# Patient Record
Sex: Female | Born: 1985 | Race: Black or African American | Hispanic: No | Marital: Single | Smoking: Never smoker
Health system: Southern US, Community
[De-identification: ages and names within clinical notes are randomized; demographics above are authoritative.]

## PROBLEM LIST (undated history)

## (undated) DIAGNOSIS — Q510 Agenesis and aplasia of uterus: Secondary | ICD-10-CM

## (undated) DIAGNOSIS — F445 Conversion disorder with seizures or convulsions: Secondary | ICD-10-CM

## (undated) DIAGNOSIS — R569 Unspecified convulsions: Secondary | ICD-10-CM

## (undated) DIAGNOSIS — I10 Essential (primary) hypertension: Secondary | ICD-10-CM

## (undated) HISTORY — PX: NO PAST SURGERIES: SHX2092

## (undated) HISTORY — DX: Agenesis and aplasia of uterus: Q51.0

## (undated) HISTORY — PX: KNEE SURGERY: SHX244

---
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
## 2003-03-29 ENCOUNTER — Emergency Department (HOSPITAL_COMMUNITY): Admission: EM | Admit: 2003-03-29 | Discharge: 2003-03-29 | Payer: Self-pay | Admitting: Emergency Medicine

## 2003-08-19 ENCOUNTER — Inpatient Hospital Stay (HOSPITAL_COMMUNITY): Admission: AD | Admit: 2003-08-19 | Discharge: 2003-08-19 | Payer: Self-pay | Admitting: Obstetrics and Gynecology

## 2003-10-02 ENCOUNTER — Emergency Department (HOSPITAL_COMMUNITY): Admission: EM | Admit: 2003-10-02 | Discharge: 2003-10-02 | Payer: Self-pay | Admitting: Emergency Medicine

## 2003-12-07 ENCOUNTER — Emergency Department (HOSPITAL_COMMUNITY): Admission: EM | Admit: 2003-12-07 | Discharge: 2003-12-07 | Payer: Self-pay

## 2003-12-16 ENCOUNTER — Ambulatory Visit (HOSPITAL_COMMUNITY): Admission: RE | Admit: 2003-12-16 | Discharge: 2003-12-16 | Payer: Self-pay | Admitting: Gastroenterology

## 2004-12-11 ENCOUNTER — Emergency Department (HOSPITAL_COMMUNITY): Admission: EM | Admit: 2004-12-11 | Discharge: 2004-12-11 | Payer: Self-pay | Admitting: Emergency Medicine

## 2007-08-25 ENCOUNTER — Emergency Department (HOSPITAL_COMMUNITY): Admission: EM | Admit: 2007-08-25 | Discharge: 2007-08-25 | Payer: Self-pay | Admitting: Emergency Medicine

## 2008-08-30 ENCOUNTER — Emergency Department (HOSPITAL_COMMUNITY): Admission: EM | Admit: 2008-08-30 | Discharge: 2008-08-30 | Payer: Self-pay | Admitting: Emergency Medicine

## 2009-04-28 ENCOUNTER — Ambulatory Visit (HOSPITAL_COMMUNITY): Admission: RE | Admit: 2009-04-28 | Discharge: 2009-04-28 | Payer: Self-pay | Admitting: Obstetrics

## 2011-02-24 ENCOUNTER — Inpatient Hospital Stay (HOSPITAL_COMMUNITY): Payer: Managed Care, Other (non HMO)

## 2011-02-24 ENCOUNTER — Inpatient Hospital Stay (HOSPITAL_COMMUNITY)
Admission: AD | Admit: 2011-02-24 | Discharge: 2011-02-25 | Disposition: A | Discharge status: Home / Self Care | Payer: Managed Care, Other (non HMO) | Source: Ambulatory Visit | Attending: Obstetrics & Gynecology | Admitting: Obstetrics & Gynecology

## 2011-02-24 DIAGNOSIS — N949 Unspecified condition associated with female genital organs and menstrual cycle: Secondary | ICD-10-CM | POA: Insufficient documentation

## 2011-02-24 DIAGNOSIS — N938 Other specified abnormal uterine and vaginal bleeding: Secondary | ICD-10-CM | POA: Insufficient documentation

## 2011-02-24 LAB — POCT PREGNANCY, URINE: Preg Test, Ur: NEGATIVE

## 2011-02-24 LAB — URINE MICROSCOPIC-ADD ON

## 2011-02-24 LAB — URINALYSIS, ROUTINE W REFLEX MICROSCOPIC
Bilirubin Urine: NEGATIVE
Glucose, UA: NEGATIVE mg/dL
Nitrite: NEGATIVE
Specific Gravity, Urine: 1.01 (ref 1.005–1.030)
pH: 6 (ref 5.0–8.0)

## 2011-02-24 LAB — CBC
Hemoglobin: 11.6 g/dL — ABNORMAL LOW (ref 12.0–15.0)
MCHC: 32.6 g/dL (ref 30.0–36.0)
RDW: 15.1 % (ref 11.5–15.5)

## 2011-02-25 LAB — URINALYSIS, ROUTINE W REFLEX MICROSCOPIC
Glucose, UA: NEGATIVE mg/dL
Leukocytes, UA: NEGATIVE
Protein, ur: NEGATIVE mg/dL
Urobilinogen, UA: 0.2 mg/dL (ref 0.0–1.0)

## 2011-02-25 LAB — URINE MICROSCOPIC-ADD ON

## 2011-03-14 ENCOUNTER — Encounter: Payer: Managed Care, Other (non HMO) | Admitting: Physician Assistant

## 2011-03-14 DIAGNOSIS — Q519 Congenital malformation of uterus and cervix, unspecified: Secondary | ICD-10-CM

## 2011-03-14 DIAGNOSIS — Q529 Congenital malformation of female genitalia, unspecified: Secondary | ICD-10-CM

## 2011-03-14 DIAGNOSIS — Q524 Other congenital malformations of vagina: Secondary | ICD-10-CM

## 2011-03-21 NOTE — Group Therapy Note (Signed)
NAME:  Sonia Morris, Sonia Morris                ACCOUNT NO.:  [id number]  MEDICAL RECORD NO.:  [id number]           PATIENT TYPE:  A  LOCATION:  [facility]                   FACILITY:  [facility]  PHYSICIAN:  Maylon Cos, CNM    DATE OF BIRTH:  15-May-1986  DATE OF SERVICE:  03/14/2011                                 CLINIC NOTE  The patient presents to the GYN Clinic at [facility].  REASON FOR TODAY'S VISIT:  Followup from [facility] for complaints of vaginal bleeding.  HISTORY OF PRESENT ILLNESS:  The patient is a 25 year old nulligravida patient who was originally seen on February 24, 2011, by __________ [facility]U with complaint of bleeding after intercourse.  The patient is known to have a questionable ambiguous genitalia on ultrasound and CT scan.  She is completing data whether she has an absent uterus versus a very small uterus.  However, she has a blind patch and very short vagina.  On the day that she presented to the [facility]U, she had had recent intercourse and had had more bleeding than usual.  Bleeding lasted for approximately 24 hours and was described as spotting on tissue after voiding.  Bleeding resolved spontaneously and she presents today for followup only at the recommendation of MAU.  She states that she has not had any additional bleeding and she feels that the bleeding was related to the fact that she had tried a different position during intercourse than previously tried with her partner.  She denies pain with intercourse.  She was previously worked up by her primary care provider as a young child, has no questions about her previous diagnosis and no concerns.  PHYSICAL EXAMINATION:  GENERAL:  Sonia Morris is a pleasant 25 year old African American female in no apparent distress. VITAL SIGNS:  Stable; temperature is 98.9, her pulse is 92, her blood pressure is 117/78, her weight is 118.9, her height is 68 inches. HEENT:  Grossly normal. BREASTS:  She has very large pendulous breasts. ABDOMEN:   Soft, nontender. GENITALIA:  She is a Tanner V.  External genitalia without lesions. Mucous membranes are pink without lesions.  She does have a very short vaginal vault that measures approximately 3 cm.  No ovaries or uterus are palpated on examination. EXTREMITIES:  Lower extremities are without edema.  ASSESSMENT: 1. Vaginal bleeding with intercourse secondary to short vaginal vault. 2. Ambiguous genitalia, resolved.  PLAN:  We will obtain all records related to previous workup to confirm diagnosis and confirm no further followup and evaluation is needed.          ______________________________ Maylon Cos, CNM    SS/MEDQ  D:  03/20/2011  T:  03/21/2011  Job:  [id number]

## 2011-04-01 ENCOUNTER — Inpatient Hospital Stay (HOSPITAL_COMMUNITY)
Admission: AD | Admit: 2011-04-01 | Discharge: 2011-04-01 | Disposition: A | Discharge status: Home / Self Care | Payer: Managed Care, Other (non HMO) | Source: Ambulatory Visit | Attending: Obstetrics and Gynecology | Admitting: Obstetrics and Gynecology

## 2011-04-01 DIAGNOSIS — N938 Other specified abnormal uterine and vaginal bleeding: Secondary | ICD-10-CM | POA: Insufficient documentation

## 2011-04-01 DIAGNOSIS — N949 Unspecified condition associated with female genital organs and menstrual cycle: Secondary | ICD-10-CM

## 2011-04-01 DIAGNOSIS — N390 Urinary tract infection, site not specified: Secondary | ICD-10-CM

## 2011-04-01 DIAGNOSIS — N8111 Cystocele, midline: Secondary | ICD-10-CM

## 2011-04-01 DIAGNOSIS — R319 Hematuria, unspecified: Secondary | ICD-10-CM

## 2011-04-01 LAB — URINALYSIS, ROUTINE W REFLEX MICROSCOPIC
Glucose, UA: NEGATIVE mg/dL
Glucose, UA: NEGATIVE mg/dL
Ketones, ur: 15 mg/dL — AB
Leukocytes, UA: NEGATIVE
Nitrite: NEGATIVE
Protein, ur: 30 mg/dL — AB
pH: 6.5 (ref 5.0–8.0)

## 2011-04-01 LAB — DIFFERENTIAL
Basophils Relative: 0 % (ref 0–1)
Eosinophils Absolute: 0.2 10*3/uL (ref 0.0–0.7)
Eosinophils Relative: 1 % (ref 0–5)
Lymphocytes Relative: 12 % (ref 12–46)
Neutrophils Relative %: 78 % — ABNORMAL HIGH (ref 43–77)

## 2011-04-01 LAB — URINE MICROSCOPIC-ADD ON

## 2011-04-01 LAB — CBC
Hemoglobin: 11.1 g/dL — ABNORMAL LOW (ref 12.0–15.0)
MCHC: 31.9 g/dL (ref 30.0–36.0)
RBC: 4.83 MIL/uL (ref 3.87–5.11)

## 2011-04-12 ENCOUNTER — Encounter: Payer: Self-pay | Admitting: Family Medicine

## 2011-04-12 ENCOUNTER — Ambulatory Visit (INDEPENDENT_AMBULATORY_CARE_PROVIDER_SITE_OTHER): Payer: Managed Care, Other (non HMO) | Admitting: Family Medicine

## 2011-04-12 DIAGNOSIS — IMO0002 Reserved for concepts with insufficient information to code with codable children: Secondary | ICD-10-CM | POA: Insufficient documentation

## 2011-04-12 DIAGNOSIS — G43909 Migraine, unspecified, not intractable, without status migrainosus: Secondary | ICD-10-CM | POA: Insufficient documentation

## 2011-04-12 DIAGNOSIS — Q510 Agenesis and aplasia of uterus: Secondary | ICD-10-CM | POA: Insufficient documentation

## 2011-04-12 DIAGNOSIS — N8111 Cystocele, midline: Secondary | ICD-10-CM

## 2011-04-12 NOTE — Patient Instructions (Signed)
Please schedule your complete physical at your convenience We'll call you with your urology appt Buy the store brand Excedrin for Migraine (Acetaminophen, Aspirin, Caffeine) and take as needed for headaches Call with any questions or concerns Have a great weekend!

## 2011-04-12 NOTE — Progress Notes (Signed)
  Subjective:    Patient ID: Sonia Morris, female    DOB: 1986/08/25, 25 y.o.   MRN: [id number]  HPI New to establish.  Previous MD- none.    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  Not having paps.  Uncertain if she has ovaries.  Will need to review records  Migraines- chronic problem for pt, will take tylenol as needed.  HAs typically last 2-3 days.  No nausea.  Will occasionally have photo/phonophobia.  Previously took Excedrin for Migraine but stopped when they pulled it from the shelves.    Cystocele- was seen in the ER on 5/28 for UTI, hematuria, and cystocele.  'it was like something fell out'.  Given 10 days of Bactrim.  Referred to urology.  Never made appt.  Pt reports feeling much better.  Cystocele is still present but 'much smaller.  You really can't tell'.  Review of Systems For ROS see HPI     Objective:   Physical Exam  Constitutional: She is oriented to person, place, and time. She appears well-developed and well-nourished. No distress.  HENT:  Head: Normocephalic and atraumatic.  Eyes: Conjunctivae and EOM are normal. Pupils are equal, round, and reactive to light.  Neck: Normal range of motion. Neck supple. No thyromegaly present.  Cardiovascular: Normal rate, regular rhythm, normal heart sounds and intact distal pulses.   No murmur heard. Pulmonary/Chest: Effort normal and breath sounds normal. No respiratory distress.  Abdominal: Soft. She exhibits no distension. There is no tenderness.  Musculoskeletal: She exhibits no edema.  Lymphadenopathy:    She has no cervical adenopathy.  Neurological: She is alert and oriented to person, place, and time. She has normal reflexes. No cranial nerve deficit.  Skin: Skin is warm and dry.  Psychiatric: She has a normal mood and affect. Her behavior is normal.          Assessment & Plan:

## 2011-04-21 ENCOUNTER — Encounter: Payer: Self-pay | Admitting: Family Medicine

## 2011-04-21 NOTE — Assessment & Plan Note (Signed)
Upon reviewing the literature, agenesis of the uterus is often associated w/ renal abnormalities.  Will refer to urology for complete evaluation

## 2011-04-21 NOTE — Assessment & Plan Note (Signed)
Pt reports this has improved symptomatically but given her uterine agenesis will need to see urology regardless of cystocele sxs.  Pt expressed understanding.

## 2011-04-21 NOTE — Assessment & Plan Note (Signed)
Pt's HAs don't sound typical of migraines other than the duration.  Discussed that while brand name Excedrin Migraine is off the market there are numerous store brand options available.  Pt to try this.  If they do not provide relief will proceed w/ migraine tx.  Reviewed supportive care and red flags that should prompt return.  Pt expressed understanding and is in agreement w/ plan.

## 2011-05-17 ENCOUNTER — Encounter: Payer: Self-pay | Admitting: Family Medicine

## 2011-05-17 ENCOUNTER — Ambulatory Visit (INDEPENDENT_AMBULATORY_CARE_PROVIDER_SITE_OTHER): Payer: Managed Care, Other (non HMO) | Admitting: Family Medicine

## 2011-05-17 DIAGNOSIS — Q510 Agenesis and aplasia of uterus: Secondary | ICD-10-CM

## 2011-05-17 DIAGNOSIS — Z0000 Encounter for general adult medical examination without abnormal findings: Secondary | ICD-10-CM | POA: Insufficient documentation

## 2011-05-17 LAB — BASIC METABOLIC PANEL
BUN: 10 mg/dL (ref 6–23)
Calcium: 8.6 mg/dL (ref 8.4–10.5)
Creatinine, Ser: 0.6 mg/dL (ref 0.4–1.2)
GFR: 163.26 mL/min (ref >60.00)
Glucose, Bld: 74 mg/dL (ref 70–99)

## 2011-05-17 LAB — CBC WITH DIFFERENTIAL/PLATELET
Basophils Relative: 0.5 % (ref 0.0–3.0)
Eosinophils Absolute: 0.2 10*3/uL (ref 0.0–0.7)
HCT: 34.8 % — ABNORMAL LOW (ref 36.0–46.0)
Hemoglobin: 11.2 g/dL — ABNORMAL LOW (ref 12.0–15.0)
Lymphs Abs: 2.4 10*3/uL (ref 0.7–4.0)
MCHC: 32.3 g/dL (ref 30.0–36.0)
MCV: 73.6 fl — ABNORMAL LOW (ref 78.0–100.0)
Monocytes Absolute: 0.6 10*3/uL (ref 0.1–1.0)
Neutro Abs: 6.9 10*3/uL (ref 1.4–7.7)
Neutrophils Relative %: 67.9 % (ref 43.0–77.0)
RBC: 4.72 Mil/uL (ref 3.87–5.11)

## 2011-05-17 LAB — LDL CHOLESTEROL, DIRECT: Direct LDL: 140.5 mg/dL

## 2011-05-17 LAB — HEPATIC FUNCTION PANEL
Albumin: 3.9 g/dL (ref 3.5–5.2)
Total Bilirubin: 0.2 mg/dL — ABNORMAL LOW (ref 0.3–1.2)

## 2011-05-17 LAB — LIPID PANEL
Cholesterol: 203 mg/dL — ABNORMAL HIGH (ref 0–200)
HDL: 55.1 mg/dL (ref >39.00)
VLDL: 15.4 mg/dL (ref 0.0–40.0)

## 2011-05-17 LAB — TSH: TSH: 0.47 u[IU]/mL (ref 0.35–5.50)

## 2011-05-17 NOTE — Assessment & Plan Note (Signed)
After reading article in UpToDate will get renal US due to association of uterine agenesis w/ renal abnormalities.  Pt expressed understanding and is in agreement w/ plan.

## 2011-05-17 NOTE — Progress Notes (Signed)
  Subjective:    Patient ID: Sonia Morris, female    DOB: [birth year], 25 y.o.   MRN: [id number]  HPI CPE- no concerns today.   Review of Systems Patient reports no vision/ hearing changes, adenopathy, fever, weight change,  persistant/recurrent hoarseness , swallowing issues, chest pain, palpitations, edema, persistant/recurrent cough, hemoptysis, dyspnea (rest/exertional/paroxysmal nocturnal), gastrointestinal bleeding (melena, rectal bleeding), abdominal pain, significant heartburn, bowel changes, GU symptoms (dysuria, hematuria, incontinence), Gyn symptoms (abnormal  bleeding, pain),  syncope, focal weakness, memory loss, numbness & tingling, skin/hair/nail changes, abnormal bruising or bleeding, anxiety, or depression.     Objective:   Physical Exam  General Appearance:    Alert, cooperative, no distress, appears stated age, overwt  Head:    Normocephalic, without obvious abnormality, atraumatic  Eyes:    PERRL, conjunctiva/corneas clear, EOM's intact, fundi    benign, both eyes  Ears:    Normal TM's and external ear canals, both ears  Nose:   Nares normal, septum midline, mucosa normal, no drainage    or sinus tenderness  Throat:   Lips, mucosa, and tongue normal; teeth and gums normal  Neck:   Supple, symmetrical, trachea midline, no adenopathy;    Thyroid: no enlargement/tenderness/nodules  Back:     Symmetric, no curvature, ROM normal, no CVA tenderness  Lungs:     Clear to auscultation bilaterally, respirations unlabored  Chest Wall:    No tenderness or deformity   Heart:    Regular rate and rhythm, S1 and S2 normal, no murmur, rub   or gallop  Breast Exam:    Deferred  Abdomen:     Soft, non-tender, bowel sounds active all four quadrants,    no masses, no organomegaly  Genitalia:    Deferred to GYN  Rectal:    Deferred to GYN  Extremities:   Extremities normal, atraumatic, no cyanosis or edema  Pulses:   2+ and symmetric all extremities  Skin:   Skin color, texture,  turgor normal, no rashes or lesions  Lymph nodes:   Cervical, supraclavicular, and axillary nodes normal  Neurologic:   CNII-XII intact, normal strength, sensation and reflexes    throughout          Assessment & Plan:

## 2011-05-17 NOTE — Assessment & Plan Note (Signed)
Pt's PE WNL.  Check labs.  Reviewed importance of healthy diet and regular exercise.

## 2011-05-17 NOTE — Patient Instructions (Signed)
We'll notify you of your lab results Someone will call to schedule your kidney ultrasound Try and get regular exercise and make healthy food choices Call with any questions or concerns Have a great summer!

## 2011-05-20 ENCOUNTER — Ambulatory Visit
Admission: RE | Admit: 2011-05-20 | Discharge: 2011-05-20 | Disposition: A | Discharge status: Home / Self Care | Payer: Managed Care, Other (non HMO) | Source: Ambulatory Visit | Attending: Family Medicine | Admitting: Family Medicine

## 2011-05-20 DIAGNOSIS — Q510 Agenesis and aplasia of uterus: Secondary | ICD-10-CM

## 2011-05-20 LAB — VITAMIN D 1,25 DIHYDROXY: Vitamin D2 1, 25 (OH)2: <8 pg/mL

## 2011-05-22 ENCOUNTER — Other Ambulatory Visit: Payer: Managed Care, Other (non HMO)

## 2011-06-06 ENCOUNTER — Telehealth: Payer: Self-pay | Admitting: Family Medicine

## 2011-06-06 NOTE — Telephone Encounter (Signed)
PER [facility], PATIENT CANCELLED HER APPOINTMENT IN JULY & DID NOT RSC'D.  I HAVE LM FOR PATIENT TO CALL ME.

## 2011-06-06 NOTE — Telephone Encounter (Signed)
Per dr hopper U/S results are as follows: kidney appear normal with the exception of incidental gallstones. Left message to call office

## 2011-06-07 NOTE — Telephone Encounter (Signed)
Kidneys are normal- but pt was supposed to see urology for a separate reason (cystocele vs urocele)

## 2011-06-07 NOTE — Telephone Encounter (Signed)
Pt return call stating that she is planning to see urology and has reschedule urology appt.

## 2011-06-07 NOTE — Telephone Encounter (Signed)
Left message to call office

## 2011-08-05 LAB — DIFFERENTIAL
Basophils Absolute: 0
Basophils Relative: 0
Eosinophils Absolute: 0.1
Eosinophils Relative: 1
Lymphocytes Relative: 17
Lymphs Abs: 2.1
Monocytes Absolute: 0.7
Monocytes Relative: 6
Neutro Abs: 9.3 — ABNORMAL HIGH
Neutrophils Relative %: 76

## 2011-08-05 LAB — BASIC METABOLIC PANEL
CO2: 27
Chloride: 101
Creatinine, Ser: 0.61
GFR calc Af Amer: >60
Potassium: 4

## 2011-08-05 LAB — CBC
HCT: 38.3
Hemoglobin: 12.2
MCHC: 31.8
MCV: 72.6 — ABNORMAL LOW
Platelets: 310
RBC: 5.28 — ABNORMAL HIGH
RDW: 14.4
WBC: 12.2 — ABNORMAL HIGH

## 2011-08-05 LAB — POCT PREGNANCY, URINE: Preg Test, Ur: NEGATIVE

## 2011-08-05 LAB — BASIC METABOLIC PANEL WITH GFR
BUN: 10
Calcium: 9.3
GFR calc non Af Amer: >60
Glucose, Bld: 97
Sodium: 137

## 2012-06-04 ENCOUNTER — Emergency Department (HOSPITAL_BASED_OUTPATIENT_CLINIC_OR_DEPARTMENT_OTHER)
Admission: EM | Admit: 2012-06-04 | Discharge: 2012-06-04 | Disposition: A | Discharge status: Home / Self Care | Payer: Self-pay | Attending: Emergency Medicine | Admitting: Emergency Medicine

## 2012-06-04 ENCOUNTER — Encounter (HOSPITAL_BASED_OUTPATIENT_CLINIC_OR_DEPARTMENT_OTHER): Payer: Self-pay | Admitting: Emergency Medicine

## 2012-06-04 DIAGNOSIS — M25569 Pain in unspecified knee: Secondary | ICD-10-CM | POA: Insufficient documentation

## 2012-06-04 NOTE — ED Notes (Signed)
States she had an old injury to her left knee that hurts intermittently.  Is here for a note for work stating she is able to do her job.

## 2012-06-04 NOTE — ED Provider Notes (Signed)
History     CSN: [id number]  Arrival date & time 06/04/12  1825   First MD Initiated Contact with Patient 06/04/12 1840      Chief Complaint  Patient presents with  . Knee Pain    (Consider location/radiation/quality/duration/timing/severity/associated sxs/prior treatment) HPI Comments: She states she's only here to get a note that says she is able to start her job tomorrow  Patient is a 26 y.o. female presenting with knee pain. The history is provided by the patient.  Knee Pain Chronicity: Patient injured her knee 5 years ago. She occasionally will have a pain in the knee but currently is not having any pain. Episode onset: 5 years ago. The problem occurs rarely. The problem has not changed since onset.Associated symptoms comments: No current complaint. Nothing aggravates the symptoms. Nothing relieves the symptoms. She has tried nothing for the symptoms.    Past Medical History  Diagnosis Date  . Migraine   . Agenesis of uterus     History reviewed. No pertinent past surgical history.  Family History  Problem Relation Age of Onset  . Hypertension Mother     History  Substance Use Topics  . Smoking status: Never Smoker   . Smokeless tobacco: Not on file  . Alcohol Use: No    OB History    Grav Para Term Preterm Abortions TAB SAB Ect Mult Living                  Review of Systems  All other systems reviewed and are negative.    Allergies  Vicodin  Home Medications   Current Outpatient Rx  Name Route Sig Dispense Refill  . ACETAMINOPHEN 500 MG PO TABS Oral Take 1,000 mg by mouth every 6 (six) hours as needed. For headache      BP 133/100  Pulse 85  Temp 98.1 F (36.7 C) (Oral)  Resp 16  Ht 5\' 9"  (1.753 m)  Wt 200 lb (90.719 kg)  BMI 29.53 kg/m2  SpO2 100%  Physical Exam  Nursing note and vitals reviewed. Constitutional: She is oriented to person, place, and time. She appears well-developed and well-nourished. No distress.  HENT:  Head:  Normocephalic and atraumatic.  Musculoskeletal:       Left knee: She exhibits normal range of motion, no swelling, no effusion, normal alignment, no LCL laxity, normal patellar mobility, no bony tenderness, normal meniscus and no MCL laxity. no tenderness found.  Neurological: She is alert and oriented to person, place, and time.  Skin: Skin is warm and dry.    ED Course  Procedures (including critical care time)  Labs Reviewed - No data to display No results found.   No diagnosis found.    MDM   Patient is here to get a work release note. She states she injured her knee 5 years ago but only has mild tenderness before it rains her when the weather changes. She denies any tenderness today in completing move her knee with a normal knee exam        Gwyneth Sprout, MD 06/04/12 1849

## 2012-08-08 ENCOUNTER — Emergency Department (HOSPITAL_COMMUNITY): Payer: 59

## 2012-08-08 ENCOUNTER — Encounter (HOSPITAL_COMMUNITY): Payer: Self-pay | Admitting: *Deleted

## 2012-08-08 ENCOUNTER — Emergency Department (HOSPITAL_COMMUNITY)
Admission: EM | Admit: 2012-08-08 | Discharge: 2012-08-08 | Disposition: A | Discharge status: Home / Self Care | Payer: 59 | Attending: Emergency Medicine | Admitting: Emergency Medicine

## 2012-08-08 DIAGNOSIS — T1490XA Injury, unspecified, initial encounter: Secondary | ICD-10-CM | POA: Insufficient documentation

## 2012-08-08 DIAGNOSIS — R079 Chest pain, unspecified: Secondary | ICD-10-CM | POA: Insufficient documentation

## 2012-08-08 DIAGNOSIS — R109 Unspecified abdominal pain: Secondary | ICD-10-CM | POA: Insufficient documentation

## 2012-08-08 DIAGNOSIS — S0003XA Contusion of scalp, initial encounter: Secondary | ICD-10-CM | POA: Insufficient documentation

## 2012-08-08 DIAGNOSIS — F10929 Alcohol use, unspecified with intoxication, unspecified: Secondary | ICD-10-CM

## 2012-08-08 DIAGNOSIS — F1010 Alcohol abuse, uncomplicated: Secondary | ICD-10-CM | POA: Insufficient documentation

## 2012-08-08 LAB — CBC WITH DIFFERENTIAL/PLATELET
Basophils Relative: 0 % (ref 0–1)
Eosinophils Absolute: 0.1 10*3/uL (ref 0.0–0.7)
HCT: 38.2 % (ref 36.0–46.0)
Hemoglobin: 12.1 g/dL (ref 12.0–15.0)
MCH: 22.7 pg — ABNORMAL LOW (ref 26.0–34.0)
MCHC: 31.7 g/dL (ref 30.0–36.0)
Monocytes Absolute: 0.6 10*3/uL (ref 0.1–1.0)
Neutro Abs: 6 10*3/uL (ref 1.7–7.7)
Neutrophils Relative %: 57 % (ref 43–77)

## 2012-08-08 LAB — BASIC METABOLIC PANEL
BUN: 9 mg/dL (ref 6–23)
Calcium: 9.3 mg/dL (ref 8.4–10.5)
GFR calc non Af Amer: >90 mL/min (ref >90)
Glucose, Bld: 86 mg/dL (ref 70–99)

## 2012-08-08 MED ORDER — IOHEXOL 300 MG/ML  SOLN: 100.0000 mL | Freq: Once | INTRAMUSCULAR | Status: DC | PRN
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
## 2012-08-08 NOTE — ED Notes (Signed)
Pt returned from radiology, boyfriend at bedside.

## 2012-11-06 ENCOUNTER — Emergency Department (HOSPITAL_BASED_OUTPATIENT_CLINIC_OR_DEPARTMENT_OTHER)
Admission: EM | Admit: 2012-11-06 | Discharge: 2012-11-06 | Disposition: A | Discharge status: Home / Self Care | Payer: Self-pay | Attending: Emergency Medicine | Admitting: Emergency Medicine

## 2012-11-06 ENCOUNTER — Encounter (HOSPITAL_BASED_OUTPATIENT_CLINIC_OR_DEPARTMENT_OTHER): Payer: Self-pay | Admitting: *Deleted

## 2012-11-06 DIAGNOSIS — Q510 Agenesis and aplasia of uterus: Secondary | ICD-10-CM | POA: Insufficient documentation

## 2012-11-06 DIAGNOSIS — Z8679 Personal history of other diseases of the circulatory system: Secondary | ICD-10-CM | POA: Insufficient documentation

## 2012-11-06 DIAGNOSIS — J069 Acute upper respiratory infection, unspecified: Secondary | ICD-10-CM | POA: Insufficient documentation

## 2012-11-06 NOTE — ED Provider Notes (Signed)
History     CSN: [id number]  Arrival date & time 11/06/12  1455   First MD Initiated Contact with Patient 11/06/12 1508      Chief Complaint  Patient presents with  . Cough    (Consider location/radiation/quality/duration/timing/severity/associated sxs/prior treatment) HPI Patient complains of cough sneeze nasal congestion and diffuse body aches onset 5 days ago. Treated with TheraFlu, without relief. No fever no shortness of breath no other associated symptoms no nausea and vomiting nothing makes symptoms better or worse Past Medical History  Diagnosis Date  . Migraine   . Agenesis of uterus     History reviewed. No pertinent past surgical history.  Family History  Problem Relation Age of Onset  . Hypertension Mother     History  Substance Use Topics  . Smoking status: Never Smoker   . Smokeless tobacco: Not on file  . Alcohol Use: No    OB History    Grav Para Term Preterm Abortions TAB SAB Ect Mult Living                  Review of Systems  Constitutional: Negative.   HENT: Positive for congestion and sneezing.   Respiratory: Positive for cough.   Cardiovascular: Negative.   Gastrointestinal: Negative.   Musculoskeletal: Positive for myalgias.  Skin: Negative.   Neurological: Negative.   Hematological: Negative.   Psychiatric/Behavioral: Negative.   All other systems reviewed and are negative.    Allergies  Vicodin  Home Medications   Current Outpatient Rx  Name  Route  Sig  Dispense  Refill  . ACETAMINOPHEN 500 MG PO TABS   Oral   Take 1,000 mg by mouth every 6 (six) hours as needed. For headache           BP 128/90  Pulse 95  Temp 98.4 F (36.9 C) (Oral)  Resp 20  SpO2 100%  Physical Exam  Nursing note and vitals reviewed. Constitutional: She appears well-developed and well-nourished.  HENT:  Head: Normocephalic and atraumatic.  Right Ear: External ear normal.  Left Ear: External ear normal.  Mouth/Throat: Oropharynx is clear  and moist. No oropharyngeal exudate.       Nasal congestion bilateral tympanic membranes normal  Eyes: Conjunctivae normal are normal. Pupils are equal, round, and reactive to light.  Neck: Neck supple. No tracheal deviation present. No thyromegaly present.  Cardiovascular: Normal rate and regular rhythm.   No murmur heard. Pulmonary/Chest: Effort normal and breath sounds normal.  Abdominal: Soft. Bowel sounds are normal. She exhibits no distension. There is no tenderness.       Obese  Musculoskeletal: Normal range of motion. She exhibits no edema and no tenderness.  Neurological: She is alert. Coordination normal.  Skin: Skin is warm and dry. No rash noted.  Psychiatric: She has a normal mood and affect.    ED Course  Procedures (including critical care time)  Labs Reviewed - No data to display No results found.   No diagnosis found.    MDM  History and exam consistent with URI. Plan symptomatic care Diagnosis upper respiratory tract infection        Doug Sou, MD 11/06/12 [phone]

## 2012-11-06 NOTE — ED Notes (Signed)
Cough, congestion and headache x 5 days.

## 2012-11-06 NOTE — ED Notes (Signed)
MD at bedside. 

## 2012-12-07 ENCOUNTER — Inpatient Hospital Stay (HOSPITAL_COMMUNITY)
Admission: AD | Admit: 2012-12-07 | Discharge: 2012-12-07 | Disposition: A | Discharge status: Home / Self Care | Payer: Self-pay | Source: Ambulatory Visit | Attending: Obstetrics & Gynecology | Admitting: Obstetrics & Gynecology

## 2012-12-07 ENCOUNTER — Encounter (HOSPITAL_COMMUNITY): Payer: Self-pay | Admitting: *Deleted

## 2012-12-07 DIAGNOSIS — N949 Unspecified condition associated with female genital organs and menstrual cycle: Secondary | ICD-10-CM | POA: Insufficient documentation

## 2012-12-07 DIAGNOSIS — Q510 Agenesis and aplasia of uterus: Secondary | ICD-10-CM | POA: Insufficient documentation

## 2012-12-07 DIAGNOSIS — N939 Abnormal uterine and vaginal bleeding, unspecified: Secondary | ICD-10-CM

## 2012-12-07 DIAGNOSIS — R109 Unspecified abdominal pain: Secondary | ICD-10-CM | POA: Insufficient documentation

## 2012-12-07 DIAGNOSIS — N938 Other specified abnormal uterine and vaginal bleeding: Secondary | ICD-10-CM | POA: Insufficient documentation

## 2012-12-07 HISTORY — DX: Essential (primary) hypertension: I10

## 2012-12-07 LAB — URINE MICROSCOPIC-ADD ON

## 2012-12-07 LAB — URINALYSIS, ROUTINE W REFLEX MICROSCOPIC
Bilirubin Urine: NEGATIVE
Glucose, UA: NEGATIVE mg/dL
Ketones, ur: NEGATIVE mg/dL
Leukocytes, UA: NEGATIVE
Nitrite: NEGATIVE
Protein, ur: NEGATIVE mg/dL
Specific Gravity, Urine: 1.02 (ref 1.005–1.030)
Urobilinogen, UA: 0.2 mg/dL (ref 0.0–1.0)
pH: 7 (ref 5.0–8.0)

## 2012-12-07 LAB — CBC
HCT: 37.5 % (ref 36.0–46.0)
MCV: 72.5 fL — ABNORMAL LOW (ref 78.0–100.0)
Platelets: 301 10*3/uL (ref 150–400)
RBC: 5.17 MIL/uL — ABNORMAL HIGH (ref 3.87–5.11)
WBC: 10.6 10*3/uL — ABNORMAL HIGH (ref 4.0–10.5)

## 2012-12-07 LAB — WET PREP, GENITAL
Trich, Wet Prep: NONE SEEN
Yeast Wet Prep HPF POC: NONE SEEN

## 2012-12-07 NOTE — MAU Provider Note (Signed)
Attestation of Attending Supervision of Advanced Practitioner (PA/CNM/NP): Evaluation and management procedures were performed by the Advanced Practitioner under my supervision and collaboration.  I have reviewed the Advanced Practitioner's note and chart, and I agree with the management and plan.  Deidrea Gaetz, MD, FACOG Attending Obstetrician & Gynecologist Faculty Practice, [facility]  

## 2012-12-07 NOTE — MAU Provider Note (Signed)
History    CSN: [id number]  Arrival date and time: 12/07/12 1222   First Provider Initiated Contact with Patient 12/07/12 1622      Chief Complaint  Patient presents with  . Vaginal Bleeding  . Abdominal Pain   HPI Patient reports to the MAU reporting of heavy vaginal bleeding over the last week. 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 The patient reports that she has had an episode of vaginal bleeding once a year for the last 5 years. Previously with the bleeding episodes, she reports that she was treated with antibiotics for a bacterial infection and that her bleeding stopped after a week or two. She reports that she has had previous pelvic exams, but does not have these exams on a regular basis.   With this episode of bleeding the patient reports that she has had cramping since last Monday that turned into vaginal bleeding after a few days. On Wednesday and Thursday of last week she was having to change her pads very frequently due to increased bleeding. She reports no bleeding yesterday, with only mild spotting again this morning. She also reports odorous vaginal discharge over the last week. She denies burning with urination but does report an increase in frequency.    Looking at previous US the patient seems to have an atrophic or absent uterus and does have both ovaries. Patient reports normal vaginal anatomy. Patient reports that she is sexually active, and reports intermittent pain with vaginal sex.  OB History    Grav Para Term Preterm Abortions TAB SAB Ect Mult Living                  Past Medical History  Diagnosis Date  . Migraine   . Agenesis of uterus   . Hypertension     Past Surgical History  Procedure Date  . No past surgeries     Family History  Problem Relation Age of Onset  . Hypertension Mother     History  Substance Use Topics  . Smoking status: Never Smoker   . Smokeless tobacco: Never Used  . Alcohol Use:  No    Allergies:  Allergies  Allergen Reactions  . Vicodin (Hydrocodone-Acetaminophen) Itching    Prescriptions prior to admission  Medication Sig Dispense Refill  . acetaminophen (TYLENOL) 500 MG tablet Take 1,000 mg by mouth every 6 (six) hours as needed. For headache        Review of Systems  Constitutional: Negative for fever.  Gastrointestinal: Positive for abdominal pain. Negative for nausea and vomiting.  Genitourinary: Positive for frequency. Negative for dysuria and flank pain.  GU: Positive for vaginal bleeding. Positive for vaginal discharge. Positive for odor.  Physical Exam   Blood pressure 122/76, pulse 83, temperature 97.6 F (36.4 C), temperature source Oral, resp. rate 20, height 5\' 7"  (1.702 m), weight 105.507 kg (232 lb 9.6 oz), SpO2 100.00%.  Physical Exam Physical Examination: General appearance - alert, well appearing, and in no distress, oriented to person, place, and time and well hydrated Abdomen - soft, nontender, nondistended, no masses or organomegaly Pelvic exam: Tissue appearing to be small cervix visualized, unable to clearly visualize os, short but otherwise normal appearing vagina with scant white discharge without odor, vaginal walls and external genitalia normal Bimanual exam: Short vaginal opening, unable to palpate cervix, no tenderness in suprapubic area or lower abdomen, adnexa palpable bilaterally without tenderness, enlargement, or mass   MAU Course  Procedures  Results for orders placed during the hospital encounter of 12/07/12 (from the past 24 hour(s))  URINALYSIS, ROUTINE W REFLEX MICROSCOPIC     Status: Abnormal   Collection Time   12/07/12  1:25 PM      Component Value Range   Color, Urine YELLOW  YELLOW   APPearance CLEAR  CLEAR   Specific Gravity, Urine 1.020  1.005 - 1.030   pH 7.0  5.0 - 8.0   Glucose, UA NEGATIVE  NEGATIVE mg/dL   Hgb urine dipstick MODERATE (*) NEGATIVE   Bilirubin Urine NEGATIVE  NEGATIVE   Ketones, ur  NEGATIVE  NEGATIVE mg/dL   Protein, ur NEGATIVE  NEGATIVE mg/dL   Urobilinogen, UA 0.2  0.0 - 1.0 mg/dL   Nitrite NEGATIVE  NEGATIVE   Leukocytes, UA NEGATIVE  NEGATIVE  URINE MICROSCOPIC-ADD ON     Status: Abnormal   Collection Time   12/07/12  1:25 PM      Component Value Range   Squamous Epithelial / LPF FEW (*) RARE   WBC, UA 0-2  <3 WBC/hpf   RBC / HPF 7-10  <3 RBC/hpf   Bacteria, UA FEW (*) RARE  CBC     Status: Abnormal   Collection Time   12/07/12  4:42 PM      Component Value Range   WBC 10.6 (*) 4.0 - 10.5 K/uL   RBC 5.17 (*) 3.87 - 5.11 MIL/uL   Hemoglobin 11.8 (*) 12.0 - 15.0 g/dL   HCT 60.4  54.0 - 98.1 %   MCV 72.5 (*) 78.0 - 100.0 fL   MCH 22.8 (*) 26.0 - 34.0 pg   MCHC 31.5  30.0 - 36.0 g/dL   RDW 19.1  47.8 - 29.5 %   Platelets 301  150 - 400 K/uL  WET PREP, GENITAL     Status: Normal   Collection Time   12/07/12  6:16 PM      Component Value Range   Yeast Wet Prep HPF POC NONE SEEN  NONE SEEN   Trich, Wet Prep NONE SEEN  NONE SEEN   Clue Cells Wet Prep HPF POC NONE SEEN  NONE SEEN   WBC, Wet Prep HPF POC NONE SEEN  NONE SEEN    Assessment and Plan   1. Vaginal bleeding   2. Agenesis of uterus     Plan: D/C home Message sent to Gyn clinic to schedule f/u for pt r/t periodic bleeding Ibuprofen OTC for mild pain Urine sent for culture Return to MAU as needed  LEFTWICH-KIRBY, Lynix Bonine 12/07/2012, 5:18 PM

## 2012-12-07 NOTE — MAU Note (Signed)
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 States she has had bleeding on and off since 1-29. Had been cramping since 1-27. Having slight cramping and spotting today. Has been heavier over the past weekend.

## 2012-12-07 NOTE — MAU Note (Signed)
Patient is not in the lobby when called to triage.  

## 2012-12-08 ENCOUNTER — Encounter: Payer: Self-pay | Admitting: Obstetrics & Gynecology

## 2012-12-09 LAB — URINE CULTURE

## 2012-12-28 ENCOUNTER — Encounter: Payer: 59 | Admitting: Obstetrics & Gynecology

## 2013-01-01 ENCOUNTER — Encounter: Payer: 59 | Admitting: Obstetrics & Gynecology

## 2013-03-12 ENCOUNTER — Encounter (HOSPITAL_BASED_OUTPATIENT_CLINIC_OR_DEPARTMENT_OTHER): Payer: Self-pay | Admitting: *Deleted

## 2013-03-12 ENCOUNTER — Emergency Department (HOSPITAL_BASED_OUTPATIENT_CLINIC_OR_DEPARTMENT_OTHER)
Admission: EM | Admit: 2013-03-12 | Discharge: 2013-03-12 | Disposition: A | Discharge status: Home / Self Care | Payer: Self-pay | Attending: Emergency Medicine | Admitting: Emergency Medicine

## 2013-03-12 DIAGNOSIS — I10 Essential (primary) hypertension: Secondary | ICD-10-CM | POA: Insufficient documentation

## 2013-03-12 DIAGNOSIS — Z8742 Personal history of other diseases of the female genital tract: Secondary | ICD-10-CM | POA: Insufficient documentation

## 2013-03-12 DIAGNOSIS — Z8679 Personal history of other diseases of the circulatory system: Secondary | ICD-10-CM | POA: Insufficient documentation

## 2013-03-12 DIAGNOSIS — H6120 Impacted cerumen, unspecified ear: Secondary | ICD-10-CM | POA: Insufficient documentation

## 2013-03-12 DIAGNOSIS — H6121 Impacted cerumen, right ear: Secondary | ICD-10-CM

## 2013-03-12 MED ORDER — CARBAMIDE PEROXIDE 6.5 % OT SOLN: 5.0000 [drp] | Freq: Two times a day (BID) | OTIC | Status: DC

## 2013-03-12 NOTE — ED Provider Notes (Signed)
History     CSN: [id number]  Arrival date & time 03/12/13  1412   First MD Initiated Contact with Patient 03/12/13 1431      Chief Complaint  Patient presents with  . Otalgia    The history is provided by the patient.   patient reports right ear pain over 24-48 hours and feels like her hearing is decreased in that right ear.  She feels like it is "full".  She's tried Q-tips without improvement in her symptoms.  No upper respiratory symptoms.  No fevers and chills.  Symptoms are mild in severity.  Nothing worsens or improves her symptoms  Past Medical History  Diagnosis Date  . Migraine   . Agenesis of uterus   . Hypertension     Past Surgical History  Procedure Laterality Date  . No past surgeries      Family History  Problem Relation Age of Onset  . Hypertension Mother     History  Substance Use Topics  . Smoking status: Never Smoker   . Smokeless tobacco: Never Used  . Alcohol Use: No    OB History   Grav Para Term Preterm Abortions TAB SAB Ect Mult Living                  Review of Systems  HENT: Positive for ear pain.   All other systems reviewed and are negative.    Allergies  Vicodin  Home Medications   Current Outpatient Rx  Name  Route  Sig  Dispense  Refill  . acetaminophen (TYLENOL) 500 MG tablet   Oral   Take 1,000 mg by mouth every 6 (six) hours as needed. For headache         . carbamide peroxide (DEBROX) 6.5 % otic solution   Right Ear   Place 5 drops into the right ear 2 (two) times daily.   15 mL   0     BP 121/85  Pulse 81  Temp(Src) 98.7 F (37.1 C) (Oral)  Resp 16  Ht 5\' 9"  (1.753 m)  Wt 200 lb (90.719 kg)  BMI 29.52 kg/m2  SpO2 99%  Physical Exam  Nursing note and vitals reviewed. Constitutional: She is oriented to person, place, and time. She appears well-developed and well-nourished.  HENT:  Head: Normocephalic.  Impacted cerumen in right external auditory canal.  No ability to visualize the right TM.  Left  TM is partially visualized behind impacted cerumen.  Eyes: EOM are normal.  Neck: Normal range of motion.  Pulmonary/Chest: Effort normal.  Abdominal: She exhibits no distension.  Musculoskeletal: Normal range of motion.  Neurological: She is alert and oriented to person, place, and time.  Psychiatric: She has a normal mood and affect.    ED Course  EAR CERUMEN REMOVAL Date/Time: 03/12/2013 2:53 PM Performed by: Lyanne Co Authorized by: Lyanne Co Consent: Verbal consent obtained. Risks and benefits: risks, benefits and alternatives were discussed Consent given by: patient Local anesthetic: none Location details: right ear Procedure type: curette Patient sedated: no Patient tolerance: Patient tolerated the procedure well with no immediate complications. Comments: Minimal cerumen removed   (including critical care time)  Labs Reviewed - No data to display No results found.   1. Impacted cerumen of right ear       MDM  Partial cerumen removal of the right ear with some improvement in her symptoms.  She'll need additional cerumen removed.  Referral to ENT  Lyanne Co, MD 03/12/13 1455

## 2013-03-12 NOTE — ED Notes (Signed)
Pt c/o right ear pain and " stopped up"

## 2013-07-16 ENCOUNTER — Emergency Department (HOSPITAL_BASED_OUTPATIENT_CLINIC_OR_DEPARTMENT_OTHER)
Admission: EM | Admit: 2013-07-16 | Discharge: 2013-07-16 | Disposition: A | Discharge status: Home / Self Care | Payer: 59 | Attending: Emergency Medicine | Admitting: Emergency Medicine

## 2013-07-16 ENCOUNTER — Encounter (HOSPITAL_BASED_OUTPATIENT_CLINIC_OR_DEPARTMENT_OTHER): Payer: Self-pay | Admitting: *Deleted

## 2013-07-16 DIAGNOSIS — N898 Other specified noninflammatory disorders of vagina: Secondary | ICD-10-CM | POA: Insufficient documentation

## 2013-07-16 DIAGNOSIS — I10 Essential (primary) hypertension: Secondary | ICD-10-CM | POA: Insufficient documentation

## 2013-07-16 DIAGNOSIS — N939 Abnormal uterine and vaginal bleeding, unspecified: Secondary | ICD-10-CM | POA: Diagnosis present

## 2013-07-16 DIAGNOSIS — B9689 Other specified bacterial agents as the cause of diseases classified elsewhere: Secondary | ICD-10-CM

## 2013-07-16 DIAGNOSIS — Z87718 Personal history of other specified (corrected) congenital malformations of genitourinary system: Secondary | ICD-10-CM | POA: Insufficient documentation

## 2013-07-16 DIAGNOSIS — N760 Acute vaginitis: Secondary | ICD-10-CM | POA: Insufficient documentation

## 2013-07-16 DIAGNOSIS — Z79899 Other long term (current) drug therapy: Secondary | ICD-10-CM | POA: Insufficient documentation

## 2013-07-16 DIAGNOSIS — Z3202 Encounter for pregnancy test, result negative: Secondary | ICD-10-CM | POA: Insufficient documentation

## 2013-07-16 DIAGNOSIS — Z7982 Long term (current) use of aspirin: Secondary | ICD-10-CM | POA: Insufficient documentation

## 2013-07-16 HISTORY — DX: Conversion disorder with seizures or convulsions: F44.5

## 2013-07-16 HISTORY — DX: Unspecified convulsions: R56.9

## 2013-07-16 LAB — URINALYSIS, ROUTINE W REFLEX MICROSCOPIC
Leukocytes, UA: NEGATIVE
Nitrite: NEGATIVE
Specific Gravity, Urine: 1.02 (ref 1.005–1.030)
Urobilinogen, UA: 0.2 mg/dL (ref 0.0–1.0)

## 2013-07-16 LAB — URINE MICROSCOPIC-ADD ON

## 2013-07-16 LAB — WET PREP, GENITAL

## 2013-07-16 MED ORDER — METRONIDAZOLE 500 MG PO TABS: 500.0000 mg | ORAL_TABLET | Freq: Two times a day (BID) | ORAL | Status: DC

## 2013-07-16 MED ORDER — IBUPROFEN 800 MG PO TABS
800.0000 mg | ORAL_TABLET | Freq: Once | ORAL | Status: AC
  Administered 2013-07-16: 800 mg via ORAL
  Filled 2013-07-16: qty 1

## 2013-07-16 NOTE — ED Provider Notes (Addendum)
CSN: [id number]     Arrival date & time 07/16/13  7829 History   First MD Initiated Contact with Patient 07/16/13 1005     Chief Complaint  Patient presents with  . Vaginal Bleeding   (Consider location/radiation/quality/duration/timing/severity/associated sxs/prior Treatment) Patient is a 27 y.o. female presenting with female genitourinary complaint. The history is provided by the patient.  Female GU Problem This is a chronic problem. The current episode started more than 2 days ago. The problem occurs daily. The problem has been gradually improving. Associated symptoms include abdominal pain. Pertinent negatives include no chest pain, no headaches and no shortness of breath. Exacerbated by: sex. Nothing relieves the symptoms. She has tried nothing for the symptoms. The treatment provided no relief.    Past Medical History  Diagnosis Date  . Migraine   . Agenesis of uterus   . Hypertension   . 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 No past surgeries     Family History  Problem Relation Age of Onset  . Hypertension Mother    History  Substance Use Topics  . Smoking status: Never Smoker   . Smokeless tobacco: Never Used  . Alcohol Use: No   OB History   Grav Para Term Preterm Abortions TAB SAB Ect Mult Living                 Review of Systems  Constitutional: Negative for fever and fatigue.  HENT: Negative for congestion, drooling and neck pain.   Eyes: Negative for pain.  Respiratory: Negative for cough and shortness of breath.   Cardiovascular: Negative for chest pain.  Gastrointestinal: Positive for abdominal pain. Negative for nausea, vomiting and diarrhea.  Genitourinary: Negative for dysuria and hematuria.  Musculoskeletal: Negative for back pain and gait problem.  Skin: Negative for color change.  Neurological: Negative for dizziness and headaches.  Hematological: Negative for adenopathy.   Psychiatric/Behavioral: Negative for behavioral problems.  All other systems reviewed and are negative.    Allergies  Vicodin  Home Medications   Current Outpatient Rx  Name  Route  Sig  Dispense  Refill  . aspirin-acetaminophen-caffeine (EXCEDRIN MIGRAINE) 250-250-65 MG per tablet   Oral   Take 1 tablet by mouth every 6 (six) hours as needed for pain.         Marland Kitchen acetaminophen (TYLENOL) 500 MG tablet   Oral   Take 1,000 mg by mouth every 6 (six) hours as needed. For headache         . carbamide peroxide (DEBROX) 6.5 % otic solution   Right Ear   Place 5 drops into the right ear 2 (two) times daily.   15 mL   0    BP 136/97  Temp(Src) 98.1 F (36.7 C) (Oral)  Resp 20  Ht 5\' 8"  (1.727 m)  Wt 230 lb (104.327 kg)  BMI 34.98 kg/m2  SpO2 97% Physical Exam  Nursing note and vitals reviewed. Constitutional: She is oriented to person, place, and time. She appears well-developed and well-nourished.  HENT:  Head: Normocephalic.  Mouth/Throat: No oropharyngeal exudate.  Eyes: Conjunctivae and EOM are normal. Pupils are equal, round, and reactive to light.  Neck: Normal range of motion. Neck supple.  Cardiovascular: Normal rate, regular rhythm, normal heart sounds and intact distal pulses.  Exam reveals no gallop and no friction rub.   No murmur heard. Pulmonary/Chest: Effort normal and breath sounds normal. No respiratory distress.  She has no wheezes.  Abdominal: Soft. Bowel sounds are normal. There is no tenderness. There is no rebound and no guarding.  Genitourinary:  Normal appearing external vagina.   Unable to visualize cervix d/t pt's increased muscle contractions in the pelvic area during the exam. She was unable to relax. However, no blood noted in the vaginal vault. No active bleeding noted. No CMT. Diffuse mild ttp during bimanual exam.   Musculoskeletal: Normal range of motion. She exhibits no edema and no tenderness.  Neurological: She is alert and oriented to  person, place, and time.  Skin: Skin is warm and dry.  Psychiatric: She has a normal mood and affect. Her behavior is normal.    ED Course  Procedures (including critical care time) Labs Review Labs Reviewed  WET PREP, GENITAL - Abnormal; Notable for the following:    Clue Cells Wet Prep HPF POC MANY (*)    WBC, Wet Prep HPF POC MANY (*)    All other components within normal limits  URINALYSIS, ROUTINE W REFLEX MICROSCOPIC - Abnormal; Notable for the following:    Hgb urine dipstick MODERATE (*)    All other components within normal limits  URINE MICROSCOPIC-ADD ON - Abnormal; Notable for the following:    Bacteria, UA FEW (*)    All other components within normal limits  GC/CHLAMYDIA PROBE AMP  PREGNANCY, URINE   Imaging Review No results found.  MDM   1. Vaginal bleeding   2. BV (bacterial vaginosis)    10:18 AM 27 y.o. 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 The patient states that she has had intermittent vaginal bleeding with sex for the last 2 years. She has followup with OB/GYN who has referred her to a specialist. She has not followed up with a specialist yet. She notes that after sex this week she has had several episodes of intermittent vaginal bleeding. She had mild spotting this morning but is not currently having any bleeding. She notes mild cramping in her pelvic area. She is afebrile and vital signs are unremarkable here. She denies any other symptoms. Will perform pelvic exam.  11:44 AM: Pt continues to appear well. Will recommend f/u w/ her obgyn.  I have discussed the diagnosis/risks/treatment options with the patient and believe the pt to be eligible for discharge home to follow-up with her obgyn next week. We also discussed returning to the ED immediately if new or worsening sx occur. We discussed the sx which are most concerning (e.g., increased or worsening  bleeding or abd pain) that necessitate immediate return. Any new prescriptions provided to the patient are listed below.  Discharge Medication List as of 07/16/2013 11:45 AM    START taking these medications   Details  metroNIDAZOLE (FLAGYL) 500 MG tablet Take 1 tablet (500 mg total) by mouth 2 (two) times daily. One po bid x 7 days, Starting 07/16/2013, Until Discontinued, Print         Junius Argyle, MD 07/17/13 1158  Junius Argyle, MD 07/17/13 1159

## 2013-07-16 NOTE — ED Notes (Signed)
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  States she has vaginal bleeding after having intercourse.  States she had seen multiple specialist and no one has been able to determine where or why the bleeding occurs.  Patient has recently seen her PCP and was referred to a specialist, which she did not follow up on.  States this week she has had bleeding for several days.

## 2013-07-16 NOTE — ED Notes (Signed)
The pelvic cart is at the bedside set up and ready for the doctor to use. 

## 2013-07-21 LAB — GC/CHLAMYDIA PROBE AMP
CT Probe RNA: NEGATIVE
GC Probe RNA: NEGATIVE

## 2014-06-20 ENCOUNTER — Emergency Department (HOSPITAL_BASED_OUTPATIENT_CLINIC_OR_DEPARTMENT_OTHER)
Admission: EM | Admit: 2014-06-20 | Discharge: 2014-06-20 | Disposition: A | Discharge status: Home / Self Care | Payer: 59 | Attending: Emergency Medicine | Admitting: Emergency Medicine

## 2014-06-20 ENCOUNTER — Encounter (HOSPITAL_BASED_OUTPATIENT_CLINIC_OR_DEPARTMENT_OTHER): Payer: Self-pay | Admitting: Emergency Medicine

## 2014-06-20 DIAGNOSIS — Z792 Long term (current) use of antibiotics: Secondary | ICD-10-CM | POA: Insufficient documentation

## 2014-06-20 DIAGNOSIS — I10 Essential (primary) hypertension: Secondary | ICD-10-CM | POA: Insufficient documentation

## 2014-06-20 DIAGNOSIS — H5789 Other specified disorders of eye and adnexa: Secondary | ICD-10-CM | POA: Insufficient documentation

## 2014-06-20 DIAGNOSIS — H5710 Ocular pain, unspecified eye: Secondary | ICD-10-CM | POA: Insufficient documentation

## 2014-06-20 DIAGNOSIS — G43909 Migraine, unspecified, not intractable, without status migrainosus: Secondary | ICD-10-CM | POA: Insufficient documentation

## 2014-06-20 DIAGNOSIS — H5712 Ocular pain, left eye: Secondary | ICD-10-CM

## 2014-06-20 DIAGNOSIS — Z7982 Long term (current) use of aspirin: Secondary | ICD-10-CM | POA: Insufficient documentation

## 2014-06-20 MED ORDER — FLUORESCEIN SODIUM 1 MG OP STRP
1.0000 | ORAL_STRIP | Freq: Once | OPHTHALMIC | Status: AC
  Administered 2014-06-20: 1 via OPHTHALMIC

## 2014-06-20 MED ORDER — TETRACAINE HCL 0.5 % OP SOLN
1.0000 [drp] | Freq: Once | OPHTHALMIC | Status: AC
  Administered 2014-06-20: 1 [drp] via OPHTHALMIC

## 2014-06-20 MED ORDER — FLUORESCEIN SODIUM 1 MG OP STRP
ORAL_STRIP | OPHTHALMIC | Status: AC
  Administered 2014-06-20: 1 via OPHTHALMIC
  Filled 2014-06-20: qty 1

## 2014-06-20 MED ORDER — TETRACAINE HCL 0.5 % OP SOLN
OPHTHALMIC | Status: AC
  Administered 2014-06-20: 1 [drp] via OPHTHALMIC
  Filled 2014-06-20: qty 2

## 2014-06-20 MED ORDER — KETOROLAC TROMETHAMINE 0.5 % OP SOLN: 1.0000 [drp] | Freq: Four times a day (QID) | OPHTHALMIC | Status: DC

## 2014-06-20 NOTE — ED Provider Notes (Signed)
Medical screening examination/treatment/procedure(s) were performed by non-physician practitioner and as supervising physician I was immediately available for consultation/collaboration.   EKG Interpretation None       Shaquill Iseman, MD 06/20/14 2325 

## 2014-06-20 NOTE — ED Notes (Signed)
PA at bedside for assessment

## 2014-06-20 NOTE — ED Notes (Signed)
Pt c/o left eye redness and drainage x 1 day

## 2014-06-20 NOTE — ED Provider Notes (Signed)
CSN: [id number]     Arrival date & time 06/20/14  1717 History   First MD Initiated Contact with Patient 06/20/14 1751     Chief Complaint  Patient presents with  . Eye Drainage     (Consider location/radiation/quality/duration/timing/severity/associated sxs/prior Treatment) Patient is a 28 y.o. female presenting with eye pain. The history is provided by the patient. No language interpreter was used.  Eye Pain This is a new problem. The current episode started yesterday. Pertinent negatives include no congestion or fever. Associated symptoms comments: Pain in left eye like a burning sensation with clear drainage. No morning matting of eye lashes. No visual changes. She does not wear contacts. She does not endorse any injury. .    Past Medical History  Diagnosis Date  . Migraine   . Agenesis of uterus   . Hypertension   . 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 No past surgeries     Family History  Problem Relation Age of Onset  . Hypertension Mother    History  Substance Use Topics  . Smoking status: Never Smoker   . Smokeless tobacco: Never Used  . Alcohol Use: No   OB History   Grav Para Term Preterm Abortions TAB SAB Ect Mult Living                 Review of Systems  Constitutional: Negative for fever.  HENT: Negative for congestion and facial swelling.   Eyes: Positive for photophobia, pain, discharge and redness. Negative for itching and visual disturbance.      Allergies  Vicodin  Home Medications   Prior to Admission medications   Medication Sig Start Date End Date Taking? Authorizing Provider  acetaminophen (TYLENOL) 500 MG tablet Take 1,000 mg by mouth every 6 (six) hours as needed. For headache    Historical Provider, MD  aspirin-acetaminophen-caffeine (EXCEDRIN MIGRAINE) [phone] MG per tablet Take 1 tablet by mouth every 6 (six) hours as needed for pain.    Historical Provider, MD   carbamide peroxide (DEBROX) 6.5 % otic solution Place 5 drops into the right ear 2 (two) times daily. 03/12/13   Lyanne CoKevin M Campos, MD  metroNIDAZOLE (FLAGYL) 500 MG tablet Take 1 tablet (500 mg total) by mouth 2 (two) times daily. One po bid x 7 days 07/16/13   Purvis SheffieldForrest Harrison, MD   BP 145/100  Pulse 91  Temp(Src) 99.1 F (37.3 C) (Oral)  Resp 18  Ht 5\' 9"  (1.753 m)  Wt 225 lb (102.059 kg)  BMI 33.21 kg/m2  SpO2 100% Physical Exam  Constitutional: She appears well-developed and well-nourished. No distress.  Eyes: EOM are normal. Pupils are equal, round, and reactive to light.  Left eye has conjunctival injection without purulent accumulation. Mild photophobia. FROM. No lid swelling or mass. No corneal abrasion or visualized foreign body.   Pulmonary/Chest: Effort normal.  Skin: Skin is warm and dry.    ED Course  Procedures (including critical care time) Labs Review Labs Reviewed - No data to display  Imaging Review No results found.   EKG Interpretation None      MDM   Final diagnoses:  None    1. Left eye pain  DDx: allergic conjunctivitis vs viral conjunctivitis. Will provide anti-inflammatory drops and ophtho referral is symptoms persist.     Arnoldo HookerShari A Cadarius Nevares, PA-C 06/20/14 1809

## 2014-06-20 NOTE — Discharge Instructions (Signed)
Allergic Conjunctivitis  The conjunctiva is a thin membrane that covers the visible white part of the eyeball and the underside of the eyelids. This membrane protects and lubricates the eye. The membrane has small blood vessels running through it that can normally be seen. When the conjunctiva becomes inflamed, the condition is called conjunctivitis. In response to the inflammation, the conjunctival blood vessels become swollen. The swelling results in redness in the normally white part of the eye.  The blood vessels of this membrane also react when a person has allergies and is then called allergic conjunctivitis. This condition usually lasts for as long as the allergy persists. Allergic conjunctivitis cannot be passed to another person (non-contagious). The likelihood of bacterial infection is great and the cause is not likely due to allergies if the inflamed eye has:  · A sticky discharge.  · Discharge or sticking together of the lids in the morning.  · Scaling or flaking of the eyelids where the eyelashes come out.  · Red swollen eyelids.  CAUSES   · Viruses.  · Irritants such as foreign bodies.  · Chemicals.  · General allergic reactions.  · Inflammation or serious diseases in the inside or the outside of the eye or the orbit (the boney cavity in which the eye sits) can cause a "red eye."  SYMPTOMS   · Eye redness.  · Tearing.  · Itchy eyes.  · Burning feeling in the eyes.  · Clear drainage from the eye.  · Allergic reaction due to pollens or ragweed sensitivity. Seasonal allergic conjunctivitis is frequent in the spring when pollens are in the air and in the fall.  DIAGNOSIS   This condition, in its many forms, is usually diagnosed based on the history and an ophthalmological exam. It usually involves both eyes. If your eyes react at the same time every year, allergies may be the cause. While most "red eyes" are due to allergy or an infection, the role of an eye (ophthalmological) exam is important. The exam  can rule out serious diseases of the eye or orbit.  TREATMENT   · Non-antibiotic eye drops, ointments, or medications by mouth may be prescribed if the ophthalmologist is sure the conjunctivitis is due to allergies alone.  · Over-the-counter drops and ointments for allergic symptoms should be used only after other causes of conjunctivitis have been ruled out, or as your caregiver suggests.  Medications by mouth are often prescribed if other allergy-related symptoms are present. If the ophthalmologist is sure that the conjunctivitis is due to allergies alone, treatment is normally limited to drops or ointments to reduce itching and burning.  HOME CARE INSTRUCTIONS   · Wash hands before and after applying drops or ointments, or touching the inflamed eye(s) or eyelids.  · Do not let the eye dropper tip or ointment tube touch the eyelid when putting medicine in your eye.  · Stop using your soft contact lenses and throw them away. Use a new pair of lenses when recovery is complete. You should run through sterilizing cycles at least three times before use after complete recovery if the old soft contact lenses are to be used. Hard contact lenses should be stopped. They need to be thoroughly sterilized before use after recovery.  · Itching and burning eyes due to allergies is often relieved by using a cool cloth applied to closed eye(s).  SEEK MEDICAL CARE IF:   · Your problems do not go away after two or three days of treatment.  ·   Your lids are sticky (especially in the morning when you wake up) or stick together.  · Discharge develops. Antibiotics may be needed either as drops, ointment, or by mouth.  · You have extreme light sensitivity.  · An oral temperature above 102° F (38.9° C) develops.  · Pain in or around the eye or any other visual symptom develops.  MAKE SURE YOU:   · Understand these instructions.  · Will watch your condition.  · Will get help right away if you are not doing well or get worse.  Document  Released: 01/11/2003 Document Revised: 01/13/2012 Document Reviewed: 12/07/2007  ExitCare® Patient Information ©2015 [employer]. This information is not intended to replace advice given to you by your health care provider. Make sure you discuss any questions you have with your health care provider.

## 2015-01-04 ENCOUNTER — Encounter (HOSPITAL_BASED_OUTPATIENT_CLINIC_OR_DEPARTMENT_OTHER): Payer: Self-pay | Admitting: *Deleted

## 2015-01-04 ENCOUNTER — Emergency Department (HOSPITAL_BASED_OUTPATIENT_CLINIC_OR_DEPARTMENT_OTHER)
Admission: EM | Admit: 2015-01-04 | Discharge: 2015-01-04 | Disposition: A | Discharge status: Home / Self Care | Payer: 59 | Attending: Emergency Medicine | Admitting: Emergency Medicine

## 2015-01-04 DIAGNOSIS — M25562 Pain in left knee: Secondary | ICD-10-CM

## 2015-01-04 DIAGNOSIS — I10 Essential (primary) hypertension: Secondary | ICD-10-CM | POA: Insufficient documentation

## 2015-01-04 DIAGNOSIS — Z791 Long term (current) use of non-steroidal anti-inflammatories (NSAID): Secondary | ICD-10-CM | POA: Insufficient documentation

## 2015-01-04 DIAGNOSIS — Z0000 Encounter for general adult medical examination without abnormal findings: Secondary | ICD-10-CM

## 2015-01-04 DIAGNOSIS — Z008 Encounter for other general examination: Secondary | ICD-10-CM | POA: Insufficient documentation

## 2015-01-04 DIAGNOSIS — Q510 Agenesis and aplasia of uterus: Secondary | ICD-10-CM | POA: Insufficient documentation

## 2015-01-04 DIAGNOSIS — Z792 Long term (current) use of antibiotics: Secondary | ICD-10-CM | POA: Insufficient documentation

## 2015-01-04 DIAGNOSIS — G43909 Migraine, unspecified, not intractable, without status migrainosus: Secondary | ICD-10-CM | POA: Insufficient documentation

## 2015-01-04 NOTE — ED Provider Notes (Signed)
CSN: [id number]     Arrival date & time 01/04/15  1313 History   First MD Initiated Contact with Patient 01/04/15 1324     Chief Complaint  Patient presents with  . Knee Pain     (Consider location/radiation/quality/duration/timing/severity/associated sxs/prior Treatment) HPI Comments: 29 year old female crusting and note for work stating that she has no restrictions regarding a prior statement from 2013 stating she had left knee issues. States she was seen in the ED in 2013 for knee pain, and since then it has been noted she has restrictions due to her left knee. States her knee has not been bothering her for the past few years since this encounter. No new injury or trauma. She does not have insurance right now and has not made an appointment with her PCP.  Patient is a 29 y.o. female presenting with knee pain. The history is provided by the patient.  Knee Pain   Past Medical History  Diagnosis Date  . Migraine   . Agenesis of uterus   . Hypertension   . 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 No past surgeries     Family History  Problem Relation Age of Onset  . Hypertension Mother    History  Substance Use Topics  . Smoking status: Never Smoker   . Smokeless tobacco: Never Used  . Alcohol Use: No   OB History    No data available     Review of Systems  All other systems reviewed and are negative.     Allergies  Vicodin  Home Medications   Prior to Admission medications   Medication Sig Start Date End Date Taking? Authorizing Provider  acetaminophen (TYLENOL) 500 MG tablet Take 1,000 mg by mouth every 6 (six) hours as needed. For headache    Historical Provider, MD  aspirin-acetaminophen-caffeine (EXCEDRIN MIGRAINE) [phone] MG per tablet Take 1 tablet by mouth every 6 (six) hours as needed for pain.    Historical Provider, MD  carbamide peroxide (DEBROX) 6.5 % otic solution Place 5 drops into the  right ear 2 (two) times daily. 03/12/13   Lyanne CoKevin M Campos, MD  ketorolac (ACULAR) 0.5 % ophthalmic solution Place 1 drop into the left eye 4 (four) times daily. 06/20/14   Shari A Upstill, PA-C  metroNIDAZOLE (FLAGYL) 500 MG tablet Take 1 tablet (500 mg total) by mouth 2 (two) times daily. One po bid x 7 days 07/16/13   Purvis SheffieldForrest Harrison, MD   BP 140/89 mmHg  Pulse 94  Temp(Src) 98.9 F (37.2 C) (Oral)  Resp 16  Ht 5\' 9"  (1.753 m)  Wt 248 lb 14.4 oz (112.9 kg)  BMI 36.74 kg/m2  SpO2 100% Physical Exam  Constitutional: She is oriented to person, place, and time. She appears well-developed and well-nourished. No distress.  HENT:  Head: Normocephalic and atraumatic.  Mouth/Throat: Oropharynx is clear and moist.  Eyes: Conjunctivae and EOM are normal.  Neck: Normal range of motion. Neck supple.  Cardiovascular: Normal rate, regular rhythm and normal heart sounds.   Pulmonary/Chest: Effort normal and breath sounds normal. No respiratory distress.  Musculoskeletal: Normal range of motion. She exhibits no edema or tenderness.  Left knee nontender. No swelling or deformity. Full range of motion without pain. No ligamentous laxity. Normal gait.  Neurological: She is alert and oriented to person, place, and time. No sensory deficit.  Skin: Skin is warm and dry.  Psychiatric: She has a  normal mood and affect. Her behavior is normal.  Nursing note and vitals reviewed.   ED Course  Procedures (including critical care time) Labs Review Labs Reviewed - No data to display  Imaging Review No results found.   EKG Interpretation None      MDM   Final diagnoses:  Normal physical exam  Left knee pain   Note given that patient has no restrictions at work as her physical exam is normal. Discussed with patient that she will need to follow-up with her PCP regarding work notes in the future. Patient states understanding of plan and is agreeable.  Kathrynn Speed, PA-C 01/04/15 1334  Arby Barrette, MD 01/05/15 [phone]

## 2015-01-04 NOTE — ED Notes (Signed)
Pt says that she needs a note stating that she does not have any restrictions for her knee pain

## 2015-01-04 NOTE — Discharge Instructions (Signed)
Medical Screening Exam A medical screening exam has been done. This exam helps find the cause of your problem and determines whether you need emergency treatment. Your exam has shown that you do not need emergency treatment at this point. It is safe for you to go to your caregiver's office or clinic for treatment. You should make an appointment today to see your caregiver as soon as he or she is available. Depending on your illness, your symptoms and condition can change over time. If your condition gets worse or you develop new or troubling symptoms before you see your caregiver, you should return to the emergency department for further evaluation.  Document Released: 11/28/2004 Document Revised: 01/13/2012 Document Reviewed: 07/10/2011 [facility] Patient Information 2015 [place], [place]. This information is not intended to replace advice given to you by your health care provider. Make sure you discuss any questions you have with your health care provider. Follow up with your primary doctor.

## 2016-07-04 ENCOUNTER — Emergency Department (HOSPITAL_BASED_OUTPATIENT_CLINIC_OR_DEPARTMENT_OTHER)
Admission: EM | Admit: 2016-07-04 | Discharge: 2016-07-04 | Disposition: A | Discharge status: Home / Self Care | Payer: Worker's Compensation | Attending: Emergency Medicine | Admitting: Emergency Medicine

## 2016-07-04 ENCOUNTER — Emergency Department (HOSPITAL_BASED_OUTPATIENT_CLINIC_OR_DEPARTMENT_OTHER): Payer: Worker's Compensation

## 2016-07-04 ENCOUNTER — Encounter (HOSPITAL_BASED_OUTPATIENT_CLINIC_OR_DEPARTMENT_OTHER): Payer: Self-pay

## 2016-07-04 DIAGNOSIS — W010XXA Fall on same level from slipping, tripping and stumbling without subsequent striking against object, initial encounter: Secondary | ICD-10-CM | POA: Insufficient documentation

## 2016-07-04 DIAGNOSIS — Y9301 Activity, walking, marching and hiking: Secondary | ICD-10-CM | POA: Diagnosis not present

## 2016-07-04 DIAGNOSIS — Y990 Civilian activity done for income or pay: Secondary | ICD-10-CM | POA: Insufficient documentation

## 2016-07-04 DIAGNOSIS — S52512A Displaced fracture of left radial styloid process, initial encounter for closed fracture: Secondary | ICD-10-CM | POA: Insufficient documentation

## 2016-07-04 DIAGNOSIS — S6992XA Unspecified injury of left wrist, hand and finger(s), initial encounter: Secondary | ICD-10-CM | POA: Diagnosis present

## 2016-07-04 DIAGNOSIS — I10 Essential (primary) hypertension: Secondary | ICD-10-CM | POA: Diagnosis not present

## 2016-07-04 DIAGNOSIS — Y929 Unspecified place or not applicable: Secondary | ICD-10-CM | POA: Insufficient documentation

## 2016-07-04 DIAGNOSIS — S52502A Unspecified fracture of the lower end of left radius, initial encounter for closed fracture: Secondary | ICD-10-CM

## 2016-07-04 MED ORDER — IBUPROFEN 800 MG PO TABS
800.0000 mg | ORAL_TABLET | Freq: Once | ORAL | Status: AC
  Administered 2016-07-04: 800 mg via ORAL
  Filled 2016-07-04: qty 1

## 2016-07-04 MED ORDER — OXYCODONE-ACETAMINOPHEN 5-325 MG PO TABS: 1.0000 | ORAL_TABLET | ORAL | 0 refills | Status: DC | PRN

## 2016-07-04 NOTE — ED Triage Notes (Signed)
Fell at work approx 430pm-pain to left wrist-NAD-steady gait

## 2016-07-04 NOTE — ED Notes (Signed)
MD at bedside. 

## 2016-07-04 NOTE — ED Provider Notes (Signed)
[facility]   CSN: [id number] Arrival date & time: 07/04/16  1951   By signing my name below, I, Clovis Pu, attest that this documentation has been prepared under the direction and in the presence of No att. providers found  Electronically Signed: Clovis Pu, ED Scribe. 07/04/16. 9:07 PM.   History   Chief Complaint Chief Complaint  Patient presents with  . Wrist Injury     The history is provided by the patient. No language interpreter was used.     Sonia Morris, right hand dominant, is a 30 y.o. female who presents to the Emergency Department complaining of constant, throbbing left wrist pain s/p slip and fall at work. Pt's patient had spilled water on floor and when she walked into room, she slipped and landed on her outstretched arm. No head injury. No LOC. No other areas or pain. Pt describes the pain as a "10/10". The pain is worsened with movement of the wrist. No alleviating factors noted. No fever, chills, numbness, weakness, or paresthesias.     Past Medical History:  Diagnosis Date  . Agenesis of uterus   . Hypertension   . Migraine   . 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 Vaginal bleeding 07/16/2013  . BV (bacterial vaginosis) 07/16/2013  . General medical examination 05/17/2011  . 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 Cystocele 04/12/2011  . Migraines 04/12/2011    Past Surgical History:  Procedure Laterality Date  . NO PAST SURGERIES      OB History    No data available       Home Medications    Prior to Admission medications   Medication Sig Start Date End Date Taking? Authorizing Provider  oxyCODONE-acetaminophen (PERCOCET/ROXICET) 5-325 MG tablet Take 1 tablet by mouth every 4 (four) hours as needed for severe pain. 07/04/16   Shaune Pollack, MD    Family History Family History  Problem Relation Age of Onset  . Hypertension Mother     Social  History Social History  Substance Use Topics  . Smoking status: Never Smoker  . Smokeless tobacco: Never Used  . Alcohol use Yes     Comment: occ     Allergies   Vicodin [hydrocodone-acetaminophen]   Review of Systems Review of Systems  Constitutional: Negative for chills and fever.  Respiratory: Negative for shortness of breath.   Cardiovascular: Negative for chest pain.  Musculoskeletal: Positive for arthralgias. Negative for neck pain.  Skin: Negative for rash and wound.  Allergic/Immunologic: Negative for immunocompromised state.  Neurological: Negative for weakness and numbness.  Hematological: Does not bruise/bleed easily.  All other systems reviewed and are negative.    Physical Exam Updated Vital Signs BP 137/97 (BP Location: Right Arm)   Pulse 72   Temp 98.9 F (37.2 C) (Oral)   Resp 16   Ht 5\' 9"  (1.753 m)   Wt 257 lb (116.6 kg)   SpO2 100%   BMI 37.95 kg/m   Physical Exam  Constitutional: She is oriented to person, place, and time. She appears well-developed and well-nourished. No distress.  HENT:  Head: Normocephalic and atraumatic.  Eyes: Conjunctivae are normal.  Neck: Neck supple.  Cardiovascular: Normal rate, regular rhythm and normal heart sounds.  Exam reveals no friction rub.   No murmur heard. Pulmonary/Chest: Effort normal and breath sounds normal. No respiratory distress. She has no wheezes. She has no rales.  Abdominal: She exhibits no distension.  Musculoskeletal: She exhibits no edema.  Neurological: She is alert and oriented to person, place, and time. She exhibits normal muscle tone.  Skin: Skin is warm. Capillary refill takes less than 2 seconds.  Psychiatric: She has a normal mood and affect.  Nursing note and vitals reviewed.   UPPER EXTREMITY EXAM: LEFT  INSPECTION & PALPATION: Moderate tenderness to palpation over distal radius and ulna. Mild swelling. No open wounds. No deformity  SENSORY: Sensation is intact to light  touch in:  Superficial radial nerve distribution (dorsal first web space) Median nerve distribution (tip of index finger)   Ulnar nerve distribution (tip of small finger)     MOTOR:  + Motor posterior interosseous nerve (thumb IP extension) + Anterior interosseous nerve (thumb IP flexion, index finger DIP flexion) + Radial nerve (wrist extension) + Median nerve (palpable firing thenar mass) + Ulnar nerve (palpable firing of first dorsal interosseous muscle)  VASCULAR: 2+ radial pulse Brisk capillary refill < 2 sec, fingers warm and well-perfused   ED Treatments / Results  DIAGNOSTIC STUDIES:  Oxygen Saturation is 100% on room air, normal by my interpretation.    COORDINATION OF CARE:  9:01 PM  Will provide her with a splint and pain medication.Discussed treatment plan with pt at bedside and pt agreed to plan.  Labs (all labs ordered are listed, but only abnormal results are displayed) Labs Reviewed - No data to display  EKG  EKG Interpretation None       Radiology Dg Wrist Complete Left  Result Date: 07/04/2016 CLINICAL DATA:  Left wrist pain after slip and fall injury today. Ulnar side pain radiating to the base of the right fifth finger. EXAM: LEFT WRIST - COMPLETE 3+ VIEW COMPARISON:  None. FINDINGS: Linear lucency along the base of the radial styloid process with suggestion of slight cortical irregularity at the radiocarpal joint may represent nondisplaced fracture. No additional fractures are demonstrated. The carpus appears intact. Soft tissues are unremarkable. IMPRESSION: Vague linear lucencies demonstrated across the base of the radial styloid process probably indicating a nondisplaced fracture. Electronically Signed   By: Burman Nieves M.D.   On: 07/04/2016 20:32    Procedures Procedures (including critical care time)  Medications Ordered in ED Medications  ibuprofen (ADVIL,MOTRIN) tablet 800 mg (800 mg Oral Given 07/04/16 2125)     Initial Impression /  Assessment and Plan / ED Course  I have reviewed the triage vital signs and the nursing notes.  Pertinent labs & imaging results that were available during my care of the patient were reviewed by me and considered in my medical decision making (see chart for details).  Clinical Course  30 year old RHD female who p/w left wrist pain s/p FOOSH at work. No head injury or other areas of tenderness. She has minimal TTP and swelling on exam and is fully intact neurovascularly distal to injury. Plain films show possible non-displaced radial styloid fx. No TTP over anatomic snuffbox. Will place in volar wrist splint, give pain control and outpt Ortho f/u. NWB. Pt in agreement.  Final Clinical Impressions(s) / ED Diagnoses   Final diagnoses:  Closed fracture of left distal radius, initial encounter    New Prescriptions Discharge Medication List as of 07/04/2016  9:39 PM    START taking these medications   Details  oxyCODONE-acetaminophen (PERCOCET/ROXICET) 5-325 MG tablet Take 1 tablet by mouth every 4 (four) hours as needed for severe pain., Starting Thu 07/04/2016, Print  I personally performed the services described in this documentation, which was scribed in my presence. The recorded information has been reviewed and is accurate.      Shaune Pollackameron Amy Gothard, MD 07/05/16 [phone]

## 2016-09-03 ENCOUNTER — Emergency Department (HOSPITAL_BASED_OUTPATIENT_CLINIC_OR_DEPARTMENT_OTHER)
Admission: EM | Admit: 2016-09-03 | Discharge: 2016-09-03 | Disposition: A | Discharge status: Home / Self Care | Payer: 59 | Attending: Emergency Medicine | Admitting: Emergency Medicine

## 2016-09-03 ENCOUNTER — Encounter (HOSPITAL_BASED_OUTPATIENT_CLINIC_OR_DEPARTMENT_OTHER): Payer: Self-pay | Admitting: *Deleted

## 2016-09-03 DIAGNOSIS — B349 Viral infection, unspecified: Secondary | ICD-10-CM | POA: Insufficient documentation

## 2016-09-03 DIAGNOSIS — I10 Essential (primary) hypertension: Secondary | ICD-10-CM | POA: Insufficient documentation

## 2016-09-03 MED ORDER — PROMETHAZINE-DM 6.25-15 MG/5ML PO SYRP: 5.0000 mL | ORAL_SOLUTION | Freq: Four times a day (QID) | ORAL | 0 refills | Status: DC | PRN

## 2016-09-03 MED ORDER — GUAIFENESIN 100 MG/5ML PO LIQD: 100.0000 mg | Freq: Four times a day (QID) | ORAL | 0 refills | Status: DC | PRN

## 2016-09-03 NOTE — ED Provider Notes (Signed)
[facility]   CSN: [id number] Arrival date & time: 09/03/16  1140     History   Chief Complaint Chief Complaint  Patient presents with  . Generalized Body Aches    HPI Sonia Morris is a 30 y.o. female.  HPI   30 year old female with history of hypertension and migraine presenting with complaints of cold symptoms. Patient states since last night she has had subjective fever, nasal congestion, sneezing, nonproductive cough, throat irritation, and generalized body aches. No specific treatment tried. Patient call out for today. She works in a nursing facility. She has not had a flu shot this year. She is a nonsmoker. She denies severe headache, neck stiffness, chest pain, shortness of breath, abdominal pain, nausea vomiting diarrhea or rash.  Past Medical History:  Diagnosis Date  . Agenesis of uterus   . Hypertension   . Migraine   . 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 Vaginal bleeding 07/16/2013  . BV (bacterial vaginosis) 07/16/2013  . General medical examination 05/17/2011  . 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 Cystocele 04/12/2011  . Migraines 04/12/2011    Past Surgical History:  Procedure Laterality Date  . NO PAST SURGERIES      OB History    No data available       Home Medications    Prior to Admission medications   Not on File    Family History Family History  Problem Relation Age of Onset  . Hypertension Mother     Social History Social History  Substance Use Topics  . Smoking status: Never Smoker  . Smokeless tobacco: Never Used  . Alcohol use Yes     Comment: occ     Allergies   Vicodin [hydrocodone-acetaminophen]   Review of Systems Review of Systems  All other systems reviewed and are negative.    Physical Exam Updated Vital Signs BP 145/98 (BP Location: Right Arm)   Pulse 79   Temp 98.1 F (36.7 C) (Oral)   Resp 18   Ht 5\' 9"   (1.753 m)   Wt 108.9 kg   SpO2 100%   BMI 35.44 kg/m   Physical Exam  Constitutional: She appears well-developed and well-nourished. No distress.  HENT:  Head: Atraumatic.  Right Ear: External ear normal.  Left Ear: External ear normal.  Nose: Nose normal.  Mouth/Throat: Oropharynx is clear and moist. No oropharyngeal exudate.  Eyes: Conjunctivae are normal.  Neck: Normal range of motion. Neck supple.  No nuchal rigidity  Cardiovascular: Normal rate and regular rhythm.   Pulmonary/Chest: Effort normal and breath sounds normal. She has no wheezes. She has no rales.  Abdominal: Soft. There is no tenderness.  Lymphadenopathy:    She has no cervical adenopathy.  Neurological: She is alert.  Skin: No rash noted.  Psychiatric: She has a normal mood and affect.  Nursing note and vitals reviewed.    ED Treatments / Results  Labs (all labs ordered are listed, but only abnormal results are displayed) Labs Reviewed - No data to display  EKG  EKG Interpretation None       Radiology No results found.  Procedures Procedures (including critical care time)  Medications Ordered in ED Medications - No data to display   Initial Impression / Assessment and Plan / ED Course  I have reviewed the triage vital signs and the nursing notes.  Pertinent labs & imaging  results that were available during my care of the patient were reviewed by me and considered in my medical decision making (see chart for details).  Clinical Course    BP 145/98 (BP Location: Right Arm)   Pulse 79   Temp 98.1 F (36.7 C) (Oral)   Resp 18   Ht 5\' 9"  (1.753 m)   Wt 108.9 kg   SpO2 100%   BMI 35.44 kg/m    Final Clinical Impressions(s) / ED Diagnoses   Final diagnoses:  Viral syndrome    New Prescriptions New Prescriptions   GUAIFENESIN (ROBITUSSIN) 100 MG/5ML LIQUID    Take 5-10 mLs (100-200 mg total) by mouth 4 (four) times daily as needed for congestion.    PROMETHAZINE-DEXTROMETHORPHAN (PROMETHAZINE-DM) 6.25-15 MG/5ML SYRUP    Take 5 mLs by mouth 4 (four) times daily as needed for cough.   12:05 PM Patient here with symptoms suggestive of URI. No concerning feature. I will provide symptomatic treatment. Return precaution discussed. Work note provided as requested.   Fayrene HelperBowie Orelia Brandstetter, PA-C 09/03/16 [phone]    Arby BarretteMarcy Pfeiffer, MD 09/12/16 1728

## 2016-09-03 NOTE — ED Triage Notes (Signed)
C/o sorethroat and body aches since last pm. Fever. Sneezing and NP cough.

## 2016-12-10 ENCOUNTER — Emergency Department (HOSPITAL_BASED_OUTPATIENT_CLINIC_OR_DEPARTMENT_OTHER)
Admission: EM | Admit: 2016-12-10 | Discharge: 2016-12-10 | Disposition: A | Discharge status: Home / Self Care | Payer: BLUE CROSS/BLUE SHIELD | Attending: Emergency Medicine | Admitting: Emergency Medicine

## 2016-12-10 ENCOUNTER — Encounter (HOSPITAL_BASED_OUTPATIENT_CLINIC_OR_DEPARTMENT_OTHER): Payer: Self-pay

## 2016-12-10 ENCOUNTER — Emergency Department (HOSPITAL_BASED_OUTPATIENT_CLINIC_OR_DEPARTMENT_OTHER): Payer: BLUE CROSS/BLUE SHIELD

## 2016-12-10 DIAGNOSIS — J40 Bronchitis, not specified as acute or chronic: Secondary | ICD-10-CM | POA: Diagnosis not present

## 2016-12-10 DIAGNOSIS — R059 Cough, unspecified: Secondary | ICD-10-CM

## 2016-12-10 DIAGNOSIS — I10 Essential (primary) hypertension: Secondary | ICD-10-CM | POA: Diagnosis not present

## 2016-12-10 DIAGNOSIS — R05 Cough: Secondary | ICD-10-CM | POA: Diagnosis present

## 2016-12-10 MED ORDER — BENZONATATE 100 MG PO CAPS: 100.0000 mg | ORAL_CAPSULE | Freq: Three times a day (TID) | ORAL | 0 refills | Status: DC

## 2016-12-10 MED ORDER — ALBUTEROL SULFATE HFA 108 (90 BASE) MCG/ACT IN AERS
2.0000 | INHALATION_SPRAY | Freq: Once | RESPIRATORY_TRACT | Status: AC
  Administered 2016-12-10: 2 via RESPIRATORY_TRACT
  Filled 2016-12-10: qty 6.7

## 2016-12-10 MED FILL — BENZONATATE 100 MG CAP: 100 | 7 days supply | Qty: 21 | Fill #0

## 2016-12-10 NOTE — Discharge Instructions (Signed)
Return to the ED with any concerns including difficulty breathing despite using albuterol every 4 hours, not drinking fluids, decreased urine output, vomiting and not able to keep down liquids or medications, decreased level of alertness/lethargy, or any other alarming symptoms °

## 2016-12-10 NOTE — ED Triage Notes (Signed)
C/o flu like s/s x 9 days-NAD-steady gait

## 2016-12-10 NOTE — ED Provider Notes (Signed)
[facility]ENCY DEPT Provider Note   CSN: [id number] Arrival date & time: 12/10/16  1246     History   Chief Complaint Chief Complaint  Patient presents with  . Cough    HPI Sonia Morris is a 31 y.o. female.  HPI  Pt presenting with c/o cough, congestion.  She has been feeling sick for the past 9 days.  She has not had fever or body aches.  No vomiting or changes in stool.  She states the congestion has been improving but cough has gotten worse over the past 2 days  She is not a cigarette smoker.  She takes nyquil at night which helps somewhat.  No specific sick contacts.  There are no other associated systemic symptoms, there are no other alleviating or modifying factors.   Past Medical History:  Diagnosis Date  . Agenesis of uterus   . Hypertension   . Migraine   . 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 Vaginal bleeding 07/16/2013  . BV (bacterial vaginosis) 07/16/2013  . General medical examination 05/17/2011  . 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 Cystocele 04/12/2011  . Migraines 04/12/2011    Past Surgical History:  Procedure Laterality Date  . NO PAST SURGERIES      OB History    No data available       Home Medications    Prior to Admission medications   Medication Sig Start Date End Date Taking? Authorizing Provider  benzonatate (TESSALON) 100 MG capsule Take 1 capsule (100 mg total) by mouth every 8 (eight) hours. 12/10/16   Jerelyn ScottMartha Linker, MD    Family History Family History  Problem Relation Age of Onset  . Hypertension Mother     Social History Social History  Substance Use Topics  . Smoking status: Never Smoker  . Smokeless tobacco: Never Used  . Alcohol use Yes     Comment: occ     Allergies   Vicodin [hydrocodone-acetaminophen]   Review of Systems Review of Systems  ROS reviewed and all otherwise negative except for mentioned in HPI   Physical Exam Updated Vital  Signs BP (!) 141/105 (BP Location: Right Arm)   Pulse 75   Temp 98.2 F (36.8 C) (Oral)   Resp 14   Ht 5\' 9"  (1.753 m)   Wt 111.1 kg   SpO2 100%   BMI 36.18 kg/m  Vitals reviewed Physical Exam  Physical Examination: General appearance - alert, well appearing, and in no distress Mental status - alert, oriented to person, place, and time Eyes - no conjunctival injection, no scleral icterus Mouth - mucous membranes moist, pharynx normal without lesions Neck - supple, no significant adenopathy Chest - clear to auscultation, no wheezes, rales or rhonchi, symmetric air entry, normal respiratory effort, frequent coughing Heart - normal rate, regular rhythm, normal S1, S2, no murmurs, rubs, clicks or gallops Abdomen - soft, nontender, nondistended, no masses or organomegaly Neurological - alert, oriented, normal speech Extremities - peripheral pulses normal, no pedal edema, no clubbing or cyanosis Skin - normal coloration and turgor, no rashes   ED Treatments / Results  Labs (all labs ordered are listed, but only abnormal results are displayed) Labs Reviewed - No data to display  EKG  EKG Interpretation None       Radiology Dg Chest 2 View  Result Date: 12/10/2016 CLINICAL DATA:  Flu-like symptoms for the past 9 days. History of  hypertension, nonsmoker. EXAM: CHEST  2 VIEW COMPARISON:  Chest x-ray of August 08, 2012 FINDINGS: The lungs are adequately inflated. There is no focal infiltrate. The interstitial markings are mildly prominent though stable. The cardiac silhouette is top-normal in size but also stable. The pulmonary vascularity is not engorged. The trachea is midline. The bony thorax exhibits no acute abnormality. IMPRESSION: Stable mild interstitial prominence likely reflects chronic bronchitis. There is no alveolar pneumonia nor CHF. Electronically Signed   By: David  [place] M.D.   On: 12/10/2016 14:32    Procedures Procedures (including critical care  time)  Medications Ordered in ED Medications  albuterol (PROVENTIL HFA;VENTOLIN HFA) 108 (90 Base) MCG/ACT inhaler 2 puff (2 puffs Inhalation Given 12/10/16 1444)     Initial Impression / Assessment and Plan / ED Course  I have reviewed the triage vital signs and the nursing notes.  Pertinent labs & imaging results that were available during my care of the patient were reviewed by me and considered in my medical decision making (see chart for details).     Pt presenting with ongoing cough after initial viral URI.  No fevers.  CXR is c/w bronchitis.  Pt given albuterol MDI to help with symptoms as well as tessalon perles.  Discharged with strict return precautions.  Pt agreeable with plan.  Final Clinical Impressions(s) / ED Diagnoses   Final diagnoses:  Cough  Bronchitis    New Prescriptions Discharge Medication List as of 12/10/2016  2:43 PM    START taking these medications   Details  benzonatate (TESSALON) 100 MG capsule Take 1 capsule (100 mg total) by mouth every 8 (eight) hours., Starting Tue 12/10/2016, Print         Jerelyn Scott, MD 12/11/16 [phone]

## 2016-12-10 NOTE — ED Notes (Signed)
Pt directed to pharmacy to pick up Rx. Work note given 

## 2017-03-17 ENCOUNTER — Encounter (HOSPITAL_BASED_OUTPATIENT_CLINIC_OR_DEPARTMENT_OTHER): Payer: Self-pay

## 2017-03-17 ENCOUNTER — Emergency Department (HOSPITAL_BASED_OUTPATIENT_CLINIC_OR_DEPARTMENT_OTHER)
Admission: EM | Admit: 2017-03-17 | Discharge: 2017-03-17 | Disposition: A | Discharge status: Home / Self Care | Payer: BLUE CROSS/BLUE SHIELD | Attending: Emergency Medicine | Admitting: Emergency Medicine

## 2017-03-17 DIAGNOSIS — R51 Headache: Secondary | ICD-10-CM | POA: Insufficient documentation

## 2017-03-17 DIAGNOSIS — I10 Essential (primary) hypertension: Secondary | ICD-10-CM | POA: Diagnosis not present

## 2017-03-17 DIAGNOSIS — R0981 Nasal congestion: Secondary | ICD-10-CM | POA: Insufficient documentation

## 2017-03-17 DIAGNOSIS — R05 Cough: Secondary | ICD-10-CM | POA: Insufficient documentation

## 2017-03-17 DIAGNOSIS — R059 Cough, unspecified: Secondary | ICD-10-CM

## 2017-03-17 MED ORDER — FLUTICASONE PROPIONATE 50 MCG/ACT NA SUSP: 2.0000 | Freq: Every day | NASAL | 0 refills | Status: DC

## 2017-03-17 MED ORDER — BENZONATATE 100 MG PO CAPS: 100.0000 mg | ORAL_CAPSULE | Freq: Three times a day (TID) | ORAL | 0 refills | Status: DC | PRN

## 2017-03-17 MED FILL — FLUTICASONE PROP 50 MCG SPR: 50 | 30 days supply | Qty: 16 | Fill #0

## 2017-03-17 MED FILL — BENZONATATE 100 MG CAP: 100 | 7 days supply | Qty: 21 | Fill #0

## 2017-03-17 NOTE — ED Provider Notes (Signed)
[facility]   CSN: [id number] Arrival date & time: 03/17/17  1140     History   Chief Complaint Chief Complaint  Patient presents with  . Nasal Congestion    HPI Sonia Morris is a 31 y.o. female who presents today with sudden onset, progressively worsening nasal congestion. She states congestion has been ongoing for 7 days, with associated cough intermittent productive of white/clear sputum. She endorses developing a dull throbbing frontal headache due to her persistent cough. She has tried generic over-the-counter allergy medication and Allegra which have not been very helpful. She had a sore throat that began 7 days ago when her symptoms began but resolved after 2-3 days.   Denies fevers, chills, CP, SOB, abd pain, n/v/d.  The history is provided by the patient.    Past Medical History:  Diagnosis Date  . Agenesis of uterus   . Hypertension   . Migraine   . 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 Vaginal bleeding 07/16/2013  . BV (bacterial vaginosis) 07/16/2013  . General medical examination 05/17/2011  . 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 Cystocele 04/12/2011  . Migraines 04/12/2011    Past Surgical History:  Procedure Laterality Date  . NO PAST SURGERIES      OB History    No data available       Home Medications    Prior to Admission medications   Medication Sig Start Date End Date Taking? Authorizing Provider  benzonatate (TESSALON) 100 MG capsule Take 1 capsule (100 mg total) by mouth 3 (three) times daily as needed for cough. 03/17/17   Katey Barrie A, PA-C  fluticasone (FLONASE) 50 MCG/ACT nasal spray Place 2 sprays into both nostrils daily. 03/17/17   Jeanie Sewer, PA-C    Family History Family History  Problem Relation Age of Onset  . Hypertension Mother     Social History Social History  Substance Use Topics  . Smoking status: Never Smoker  . Smokeless  tobacco: Never Used  . Alcohol use Yes     Comment: occ     Allergies   Vicodin [hydrocodone-acetaminophen]   Review of Systems Review of Systems  Constitutional: Negative for chills and fever.  Respiratory: Positive for cough. Negative for shortness of breath.   Cardiovascular: Negative for chest pain.  Gastrointestinal: Negative for abdominal pain, diarrhea, nausea and vomiting.  Neurological: Positive for headaches.  All other systems reviewed and are negative.    Physical Exam Updated Vital Signs BP (!) 145/96 (BP Location: Left Arm)   Pulse 83   Temp 99 F (37.2 C) (Oral)   Resp 18   Ht 5\' 9"  (1.753 m)   Wt 108.9 kg   LMP  (LMP Unknown)   SpO2 99%   BMI 35.44 kg/m   Physical Exam  Constitutional: She appears well-developed and well-nourished. No distress.  HENT:  Head: Normocephalic and atraumatic.  Right Ear: External ear normal.  Left Ear: External ear normal.  Mouth/Throat: Oropharynx is clear and moist.  No maxillary or frontal sinus TTP. TMs normal bilaterally. Nasal septum midline with pink mucosa and clear drainage. Post nasal drip noted in the posterior oropharynx. Audibly congested.   Eyes: Conjunctivae are normal. Pupils are equal, round, and reactive to light. Right eye exhibits no discharge. Left eye exhibits no discharge. No scleral icterus.  Neck: Normal range of motion. Neck supple. No JVD present.  No tracheal deviation present.  Cardiovascular: Normal rate, regular rhythm, normal heart sounds and intact distal pulses.   No murmur heard. 2+ radial pulses bl  Pulmonary/Chest: Effort normal and breath sounds normal. No respiratory distress. She has no wheezes.  Abdominal: Soft. There is no tenderness.  Musculoskeletal: She exhibits no edema.  Lymphadenopathy:    She has no cervical adenopathy.  Neurological: She is alert.  Skin: Skin is warm and dry. Capillary refill takes less than 2 seconds.  Psychiatric: She has a normal mood and affect. Her  behavior is normal.  Nursing note and vitals reviewed.    ED Treatments / Results  Labs (all labs ordered are listed, but only abnormal results are displayed) Labs Reviewed - No data to display  EKG  EKG Interpretation None       Radiology No results found.  Procedures Procedures (including critical care time)  Medications Ordered in ED Medications - No data to display   Initial Impression / Assessment and Plan / ED Course  I have reviewed the triage vital signs and the nursing notes.  Pertinent labs & imaging results that were available during my care of the patient were reviewed by me and considered in my medical decision making (see chart for details).     Patient with URI symptoms. Afebrile, discussed hypertensive reading while in ED. Patient states she has a family history of hypertension but does not take any hypertension medications. Encouraged patient to follow-up with primary care for further evaluation, which patient agreed to do. Lungs clear to auscultation, low suspicion of pneumonia, pleural effusion, or other lung pathology. Likely viral URI. Discussed use of Tessalon for cough and Flonase for congestion. Encourage follow-up with primary care for reevaluation. Discussed strict ED return precautions. Pt verbalized understanding of and agreement with plan and is safe for discharge home at this time.  Final Clinical Impressions(s) / ED Diagnoses   Final diagnoses:  Cough  Nasal congestion    New Prescriptions Discharge Medication List as of 03/17/2017 12:27 PM    START taking these medications   Details  benzonatate (TESSALON) 100 MG capsule Take 1 capsule (100 mg total) by mouth 3 (three) times daily as needed for cough., Starting Mon 03/17/2017, Print    fluticasone (FLONASE) 50 MCG/ACT nasal spray Place 2 sprays into both nostrils daily., Starting Mon 03/17/2017, Print         Luevenia MaxinFawze, [place]uitmanMina A, PA-C 03/17/17 2014    Geoffery Lyonselo, [place], MD 03/24/17  0120

## 2017-03-17 NOTE — ED Triage Notes (Signed)
c/o sinus congestion, cough x 1 week-NAD-steady gait

## 2017-03-17 NOTE — Discharge Instructions (Signed)
Take Tessalon for cough, Flonase as needed for congestion. Try a different allergy medication for your symptoms. You may continue using NyQuil at night. Follow up with a primary care physician for reevaluation of your blood pressure. Return to the ED if any concerning symptoms develop.

## 2017-06-17 ENCOUNTER — Emergency Department (HOSPITAL_BASED_OUTPATIENT_CLINIC_OR_DEPARTMENT_OTHER): Payer: BLUE CROSS/BLUE SHIELD

## 2017-06-17 ENCOUNTER — Encounter (HOSPITAL_BASED_OUTPATIENT_CLINIC_OR_DEPARTMENT_OTHER): Payer: Self-pay | Admitting: *Deleted

## 2017-06-17 ENCOUNTER — Emergency Department (HOSPITAL_BASED_OUTPATIENT_CLINIC_OR_DEPARTMENT_OTHER)
Admission: EM | Admit: 2017-06-17 | Discharge: 2017-06-17 | Disposition: A | Discharge status: Home / Self Care | Payer: BLUE CROSS/BLUE SHIELD | Attending: Emergency Medicine | Admitting: Emergency Medicine

## 2017-06-17 DIAGNOSIS — R51 Headache: Secondary | ICD-10-CM | POA: Insufficient documentation

## 2017-06-17 DIAGNOSIS — R0789 Other chest pain: Secondary | ICD-10-CM | POA: Diagnosis present

## 2017-06-17 DIAGNOSIS — F430 Acute stress reaction: Secondary | ICD-10-CM

## 2017-06-17 DIAGNOSIS — I10 Essential (primary) hypertension: Secondary | ICD-10-CM | POA: Diagnosis not present

## 2017-06-17 DIAGNOSIS — R519 Headache, unspecified: Secondary | ICD-10-CM

## 2017-06-17 LAB — CBC
HCT: 35.6 % — ABNORMAL LOW (ref 36.0–46.0)
Hemoglobin: 11.6 g/dL — ABNORMAL LOW (ref 12.0–15.0)
MCH: 23.4 pg — ABNORMAL LOW (ref 26.0–34.0)
MCHC: 32.6 g/dL (ref 30.0–36.0)
MCV: 71.9 fL — ABNORMAL LOW (ref 78.0–100.0)
Platelets: 314 10*3/uL (ref 150–400)
RBC: 4.95 MIL/uL (ref 3.87–5.11)
RDW: 14.9 % (ref 11.5–15.5)
WBC: 9.9 10*3/uL (ref 4.0–10.5)

## 2017-06-17 LAB — BASIC METABOLIC PANEL
Anion gap: 8 (ref 5–15)
BUN: 11 mg/dL (ref 6–20)
CALCIUM: 8.6 mg/dL — AB (ref 8.9–10.3)
CO2: 24 mmol/L (ref 22–32)
Chloride: 105 mmol/L (ref 101–111)
Creatinine, Ser: 0.67 mg/dL (ref 0.44–1.00)
GFR calc Af Amer: >60 mL/min (ref >60)
GFR calc non Af Amer: >60 mL/min (ref >60)
Glucose, Bld: 90 mg/dL (ref 65–99)
Potassium: 3.9 mmol/L (ref 3.5–5.1)
Sodium: 137 mmol/L (ref 135–145)

## 2017-06-17 LAB — TROPONIN I: Troponin I: <0.03 ng/mL (ref <0.03)

## 2017-06-17 NOTE — ED Notes (Signed)
H/a worse and worse over the last 2 weeks they comE  And go, cp/ tightness since Sunday , no n/v/sob, no blurry vision,  Takes meds but it comes back, has been stressed

## 2017-06-17 NOTE — Discharge Instructions (Signed)
Return to the Emergency Department is symptoms significantly worsen or change.

## 2017-06-17 NOTE — ED Provider Notes (Signed)
[facility]   CSN: [id number] Arrival date & time: 06/17/17  1335     History   Chief Complaint Chief Complaint  Patient presents with  . Chest Pain  . Headache    HPI Sonia Morris is a 31 y.o. female.  Patient is a 31 year old female with past medical history of migraine headaches and hypertension. She presents today for evaluation of chest pain and headaches over the past 3 days. She denies any shortness of breath, numbness, or tingling. She has a lot of stress in her life related to work and relationship issues and believes this is the likely cause. She denies any exertional symptoms.   The history is provided by the patient.  Chest Pain   This is a new problem. Episode onset: 3 days ago. The problem occurs constantly. The problem has been gradually worsening. The pain is associated with an emotional upset. The pain is present in the substernal region. The quality of the pain is described as heavy. The pain does not radiate. Associated symptoms include headaches. Pertinent negatives include no shortness of breath.  Headache   Pertinent negatives include no shortness of breath.    Past Medical History:  Diagnosis Date  . Agenesis of uterus   . Hypertension   . Migraine   . 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 Vaginal bleeding 07/16/2013  . BV (bacterial vaginosis) 07/16/2013  . General medical examination 05/17/2011  . 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 Cystocele 04/12/2011  . Migraines 04/12/2011    Past Surgical History:  Procedure Laterality Date  . NO PAST SURGERIES      OB History    No data available       Home Medications    Prior to Admission medications   Medication Sig Start Date End Date Taking? Authorizing Provider  benzonatate (TESSALON) 100 MG capsule Take 1 capsule (100 mg total) by mouth 3 (three) times daily as needed for cough. 03/17/17   Fawze,  Mina A, PA-C  fluticasone (FLONASE) 50 MCG/ACT nasal spray Place 2 sprays into both nostrils daily. 03/17/17   Jeanie Sewer, PA-C    Family History Family History  Problem Relation Age of Onset  . Hypertension Mother     Social History Social History  Substance Use Topics  . Smoking status: Never Smoker  . Smokeless tobacco: Never Used  . Alcohol use Yes     Comment: occ     Allergies   Vicodin [hydrocodone-acetaminophen]   Review of Systems Review of Systems  Respiratory: Negative for shortness of breath.   Cardiovascular: Positive for chest pain.  Neurological: Positive for headaches.  All other systems reviewed and are negative.    Physical Exam Updated Vital Signs BP (!) 161/117 (BP Location: Right Arm)   Pulse (!) 101   Temp 98.9 F (37.2 C) (Oral)   Resp 20   Ht 5\' 9"  (1.753 m)   Wt 108.9 kg (240 lb)   SpO2 100%   BMI 35.44 kg/m   Physical Exam  Constitutional: She is oriented to person, place, and time. She appears well-developed and well-nourished. No distress.  HENT:  Head: Normocephalic and atraumatic.  Eyes: Pupils are equal, round, and reactive to light. EOM are normal.  Neck: Normal range of motion. Neck supple.  Cardiovascular: Normal rate and regular rhythm.  Exam reveals no gallop and no friction rub.  No murmur heard. Pulmonary/Chest: Effort normal and breath sounds normal. No respiratory distress. She has no wheezes.  Abdominal: Soft. Bowel sounds are normal. She exhibits no distension. There is no tenderness.  Musculoskeletal: Normal range of motion.  Neurological: She is alert and oriented to person, place, and time. No cranial nerve deficit. She exhibits normal muscle tone. Coordination normal.  Skin: Skin is warm and dry. She is not diaphoretic.  Nursing note and vitals reviewed.    ED Treatments / Results  Labs (all labs ordered are listed, but only abnormal results are displayed) Labs Reviewed  BASIC METABOLIC PANEL -  Abnormal; Notable for the following:       Result Value   Calcium 8.6 (*)    All other components within normal limits  CBC - Abnormal; Notable for the following:    Hemoglobin 11.6 (*)    HCT 35.6 (*)    MCV 71.9 (*)    MCH 23.4 (*)    All other components within normal limits  TROPONIN I    EKG  EKG Interpretation  Date/Time:  Tuesday June 17 2017 13:46:46 EDT Ventricular Rate:  85 PR Interval:  146 QRS Duration: 82 QT Interval:  366 QTC Calculation: 435 R Axis:   69 Text Interpretation:  Normal sinus rhythm Normal ECG No old tracing to compare Confirmed by Lake BungeeJacubowitz, Doreatha MartinSam [phone] on 06/17/2017 1:52:03 PM       Radiology Dg Chest 2 View  Result Date: 06/17/2017 CLINICAL DATA:  Chest pain EXAM: CHEST  2 VIEW COMPARISON:  Chest radiograph 12/10/2016 FINDINGS: The heart size and mediastinal contours are within normal limits. Both lungs are clear. The visualized skeletal structures are unremarkable. IMPRESSION: No active cardiopulmonary disease. Electronically Signed   By: Deatra RobinsonKevin  Herman M.D.   On: 06/17/2017 15:15    Procedures Procedures (including critical care time)  Medications Ordered in ED Medications - No data to display   Initial Impression / Assessment and Plan / ED Course  I have reviewed the triage vital signs and the nursing notes.  Pertinent labs & imaging results that were available during my care of the patient were reviewed by me and considered in my medical decision making (see chart for details).  Cardiac workup is unremarkable. Her neurologic exam is nonfocal and she appears in no distress. I highly suspect that her symptoms are related to stress. I see no indication for further workup. I believe she is appropriate for discharge with outpatient follow-up as needed. She has requested a work excuse and this will be given.  Final Clinical Impressions(s) / ED Diagnoses   Final diagnoses:  None    New Prescriptions New Prescriptions   No  medications on file     Geoffery Lyonselo, Leandrew Keech, MD 06/17/17 1535

## 2017-06-17 NOTE — ED Triage Notes (Signed)
2 days she has had a migraine. Chest tightness. She is crying at triage while telling me about her symptoms. She is under a lot of stress.

## 2017-10-01 IMAGING — CR DG WRIST COMPLETE 3+V*L*
4 series · 4 of 4 positions shown · non-contrast
Comparison: None.

CLINICAL DATA: Left wrist pain after slip and fall injury today.
Ulnar side pain radiating to the base of the right fifth finger.

EXAM:
LEFT WRIST - COMPLETE 3+ VIEW

[x wrist pa left]
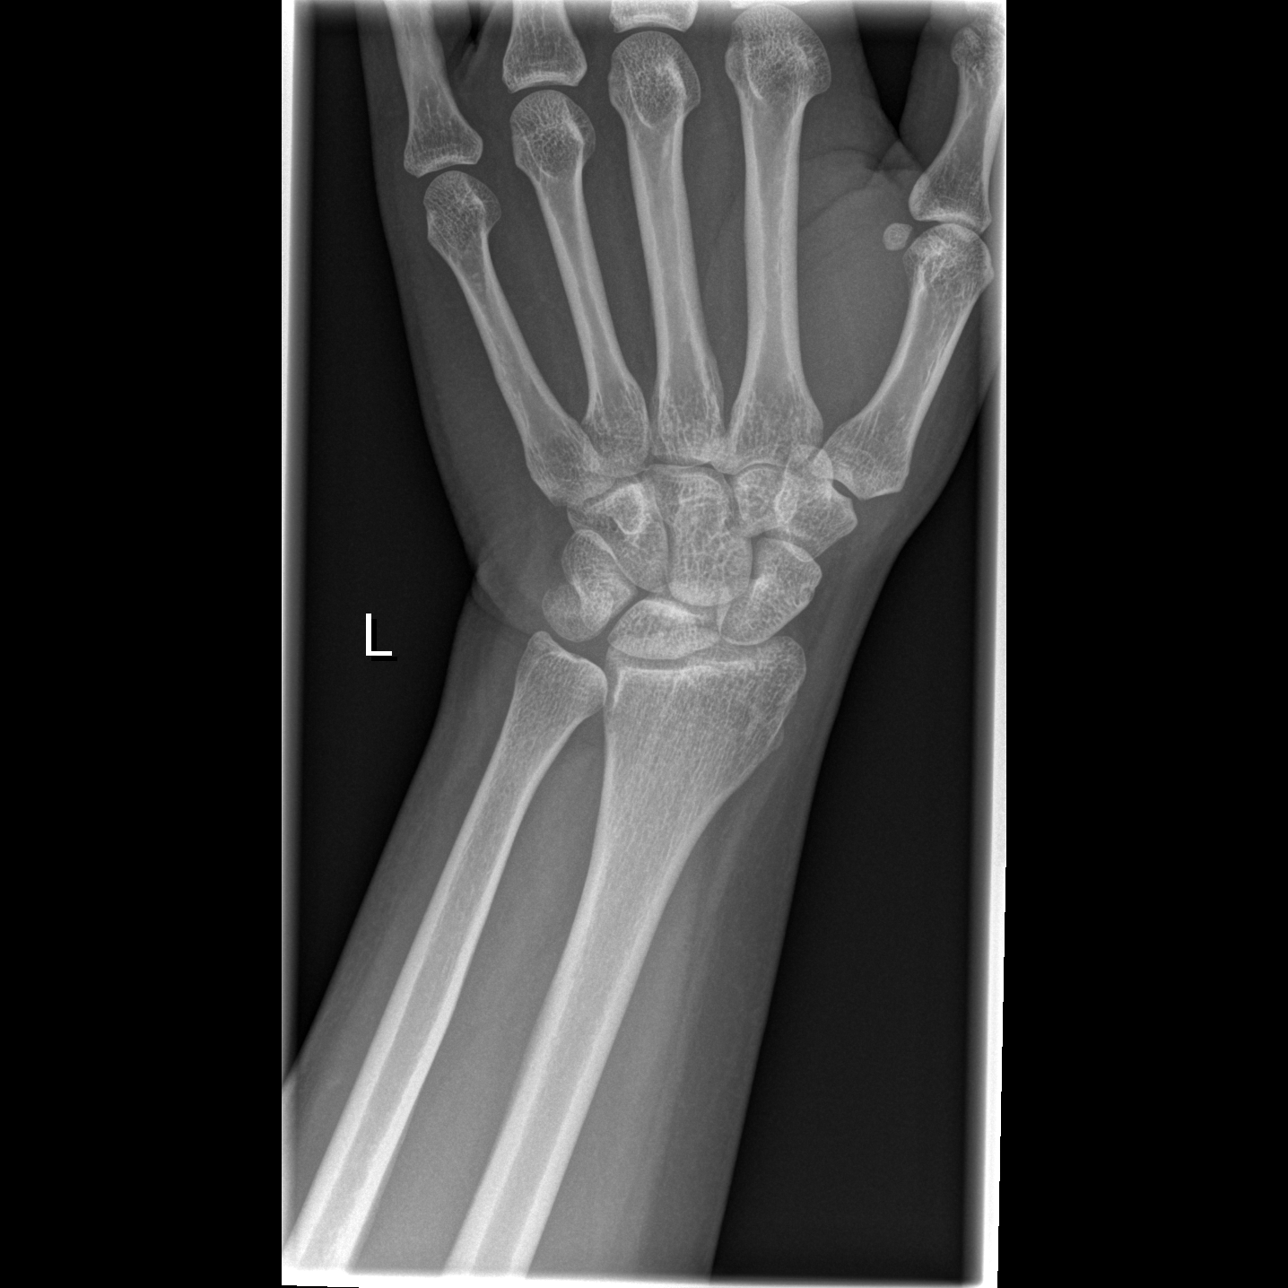

[x wrist obl left]
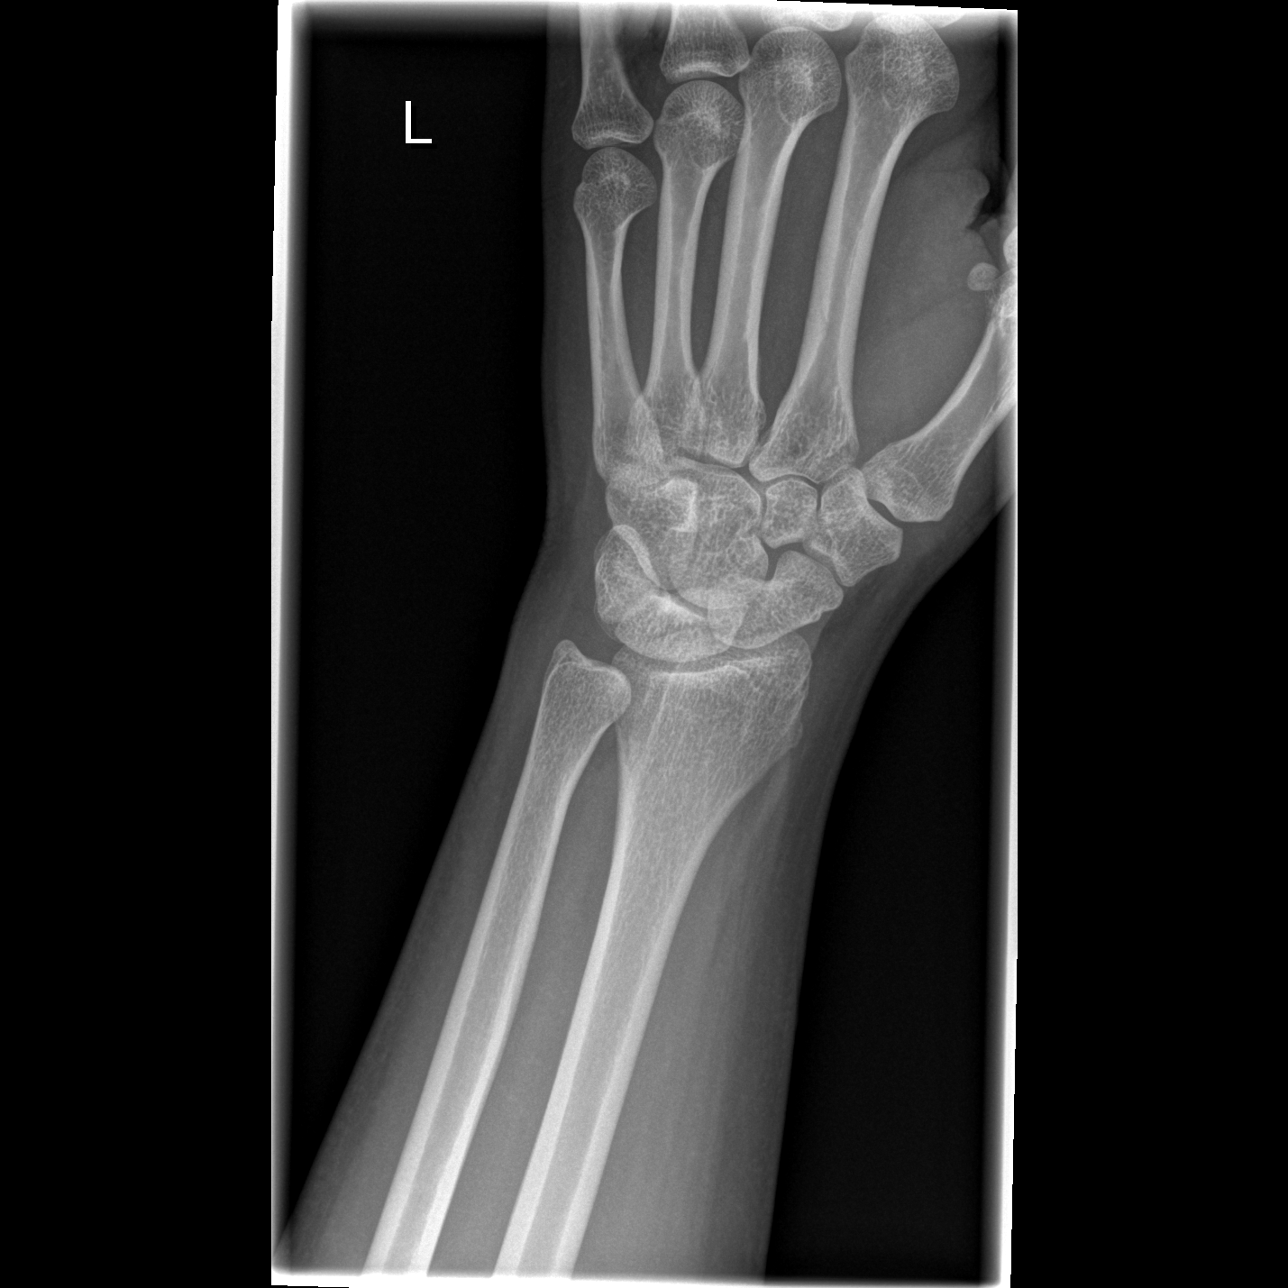

[x wrist lat left]
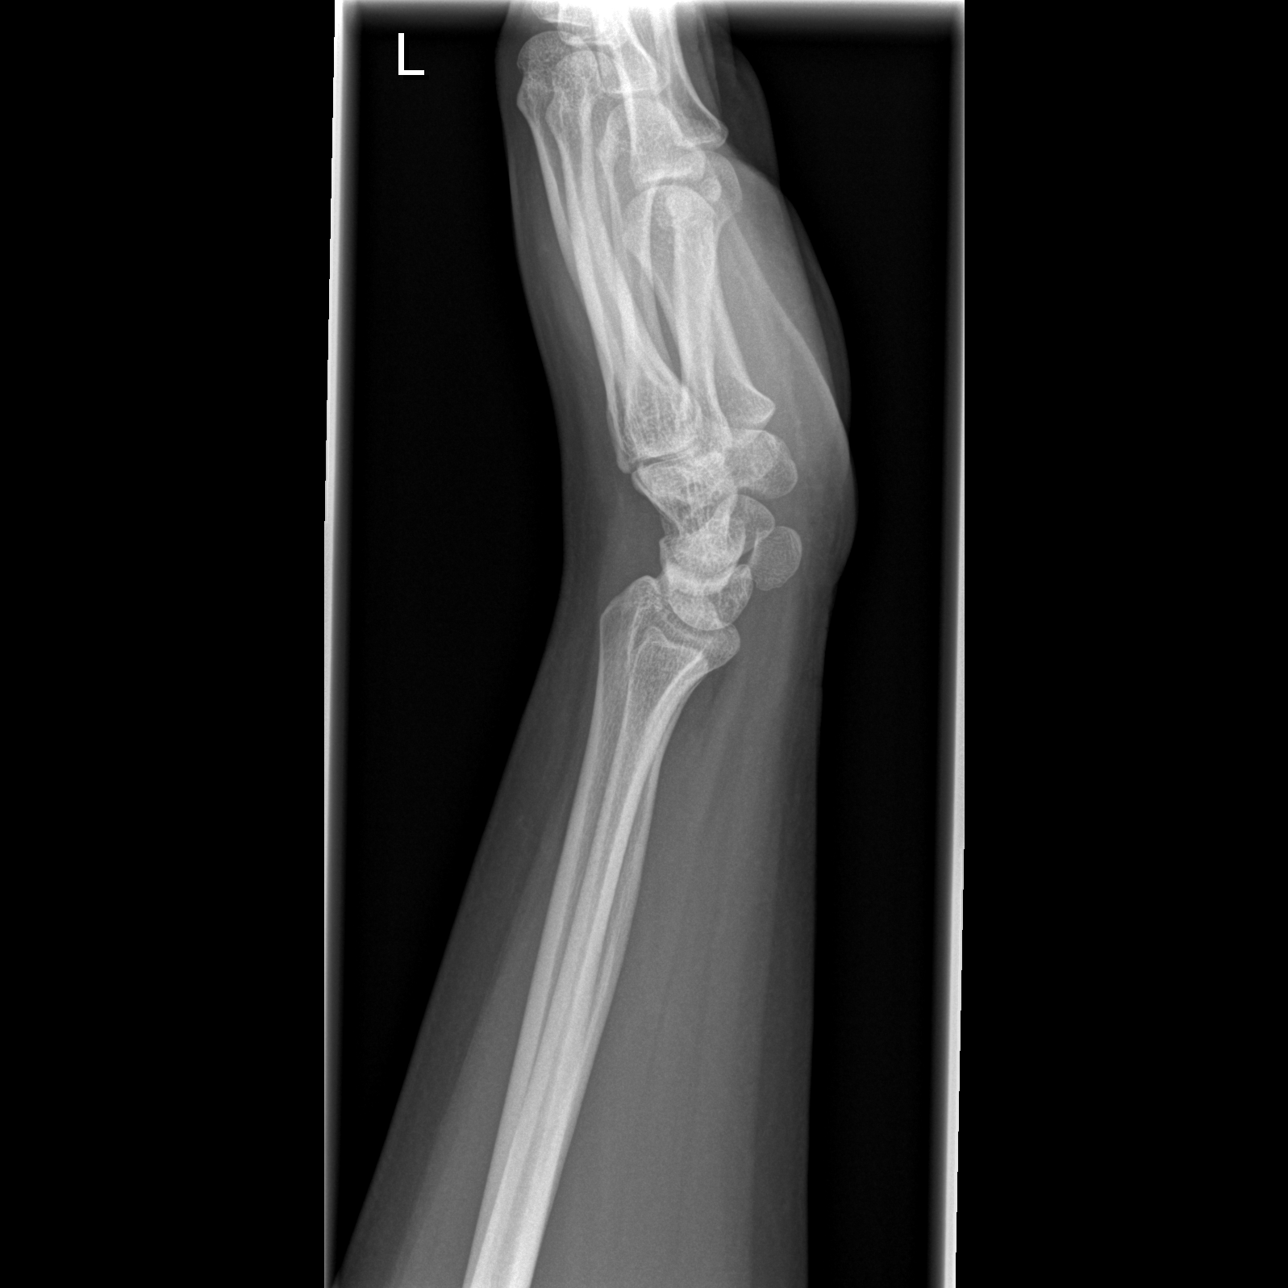

[x navicular]
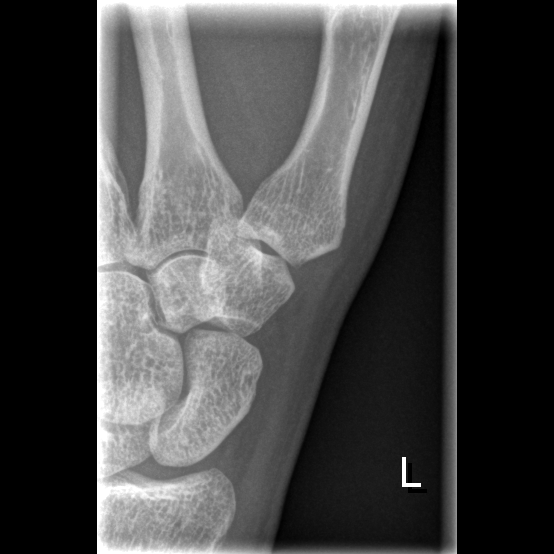

[4 of 4 positions shown; findings below may reference images not displayed]

FINDINGS: Linear lucency along the base of the radial styloid process with
suggestion of slight cortical irregularity at the radiocarpal joint
may represent nondisplaced fracture. No additional fractures are
demonstrated. The carpus appears intact. Soft tissues are
unremarkable.
IMPRESSION: Vague linear lucencies demonstrated across the base of the radial
styloid process probably indicating a nondisplaced fracture.

## 2018-03-09 IMAGING — DX DG CHEST 2V
2 series · 2 of 2 positions shown · non-contrast
Comparison: Chest x-ray of August 08, 2012

CLINICAL DATA: Flu-like symptoms for the past 9 days. History of
hypertension, nonsmoker.

EXAM:
CHEST  2 VIEW

[chest pa]
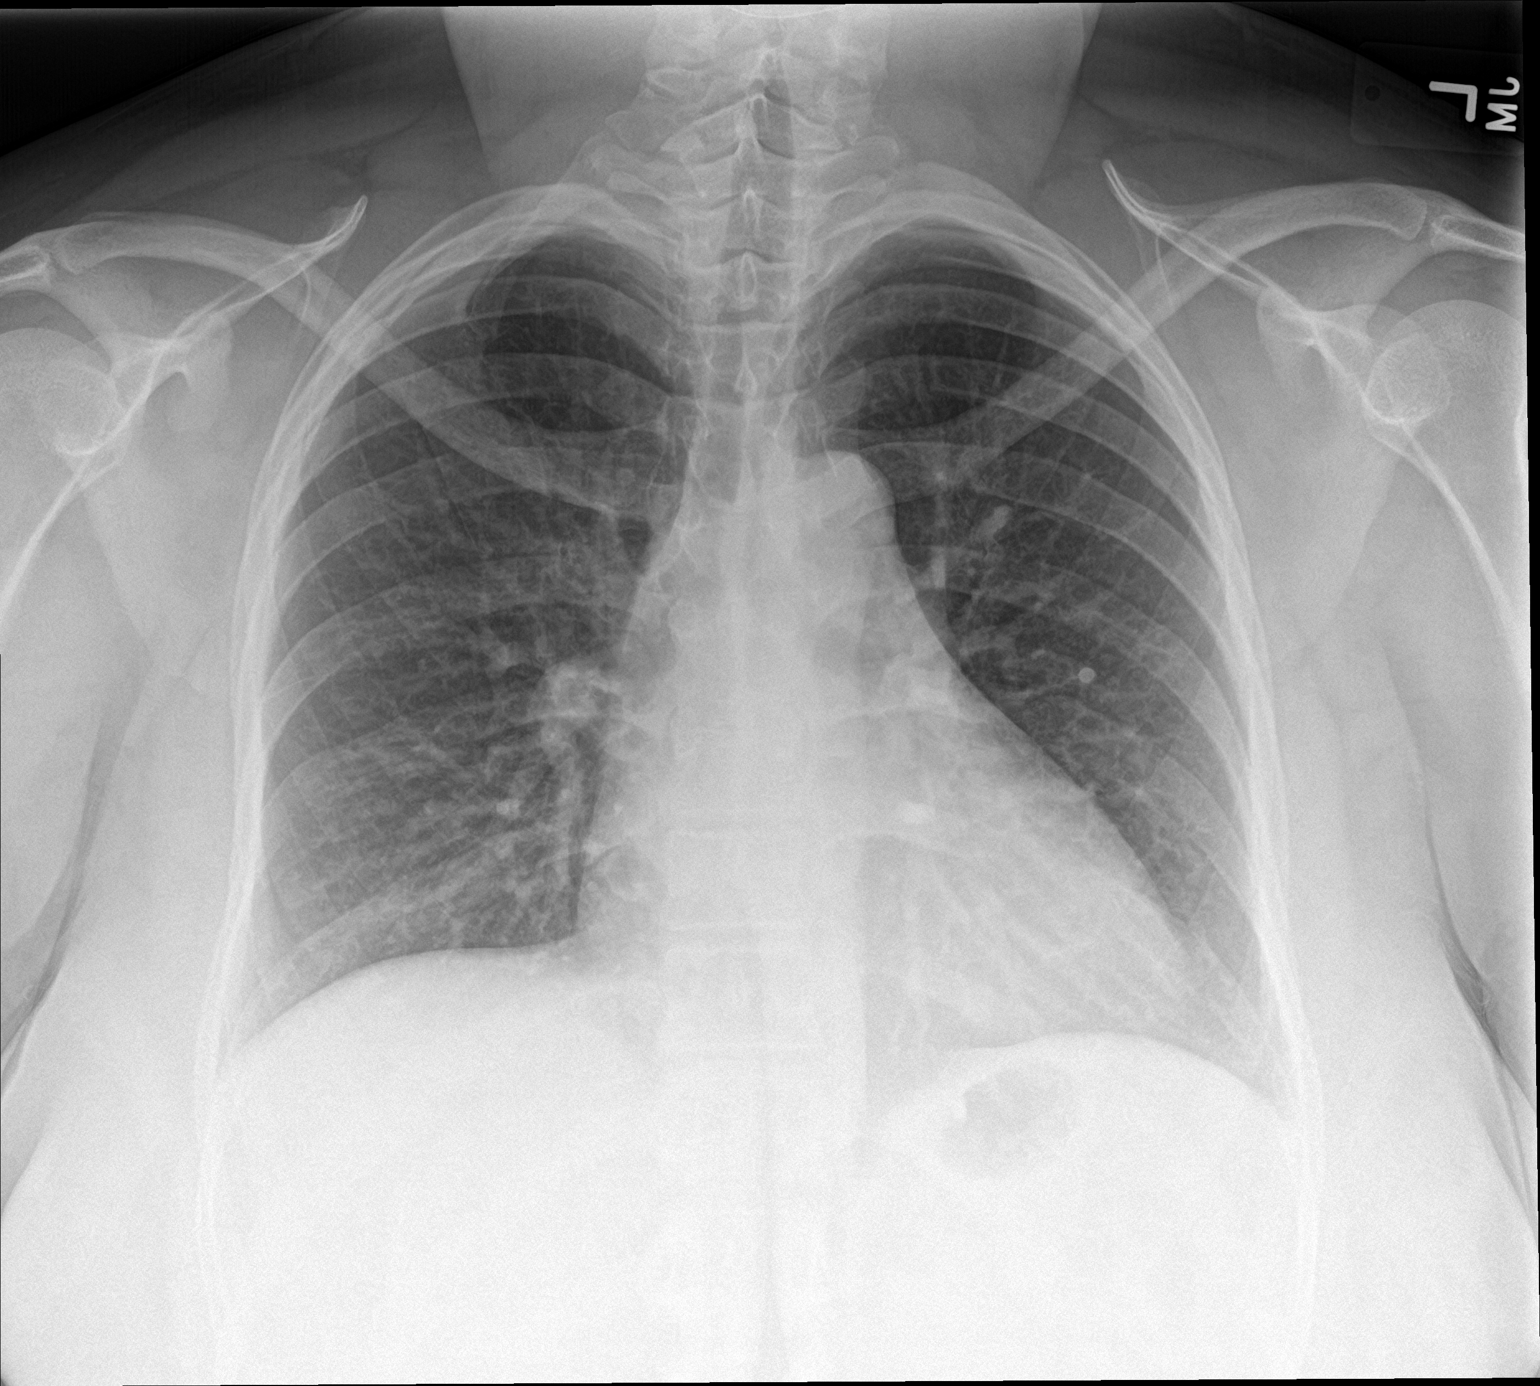

[chest lat]
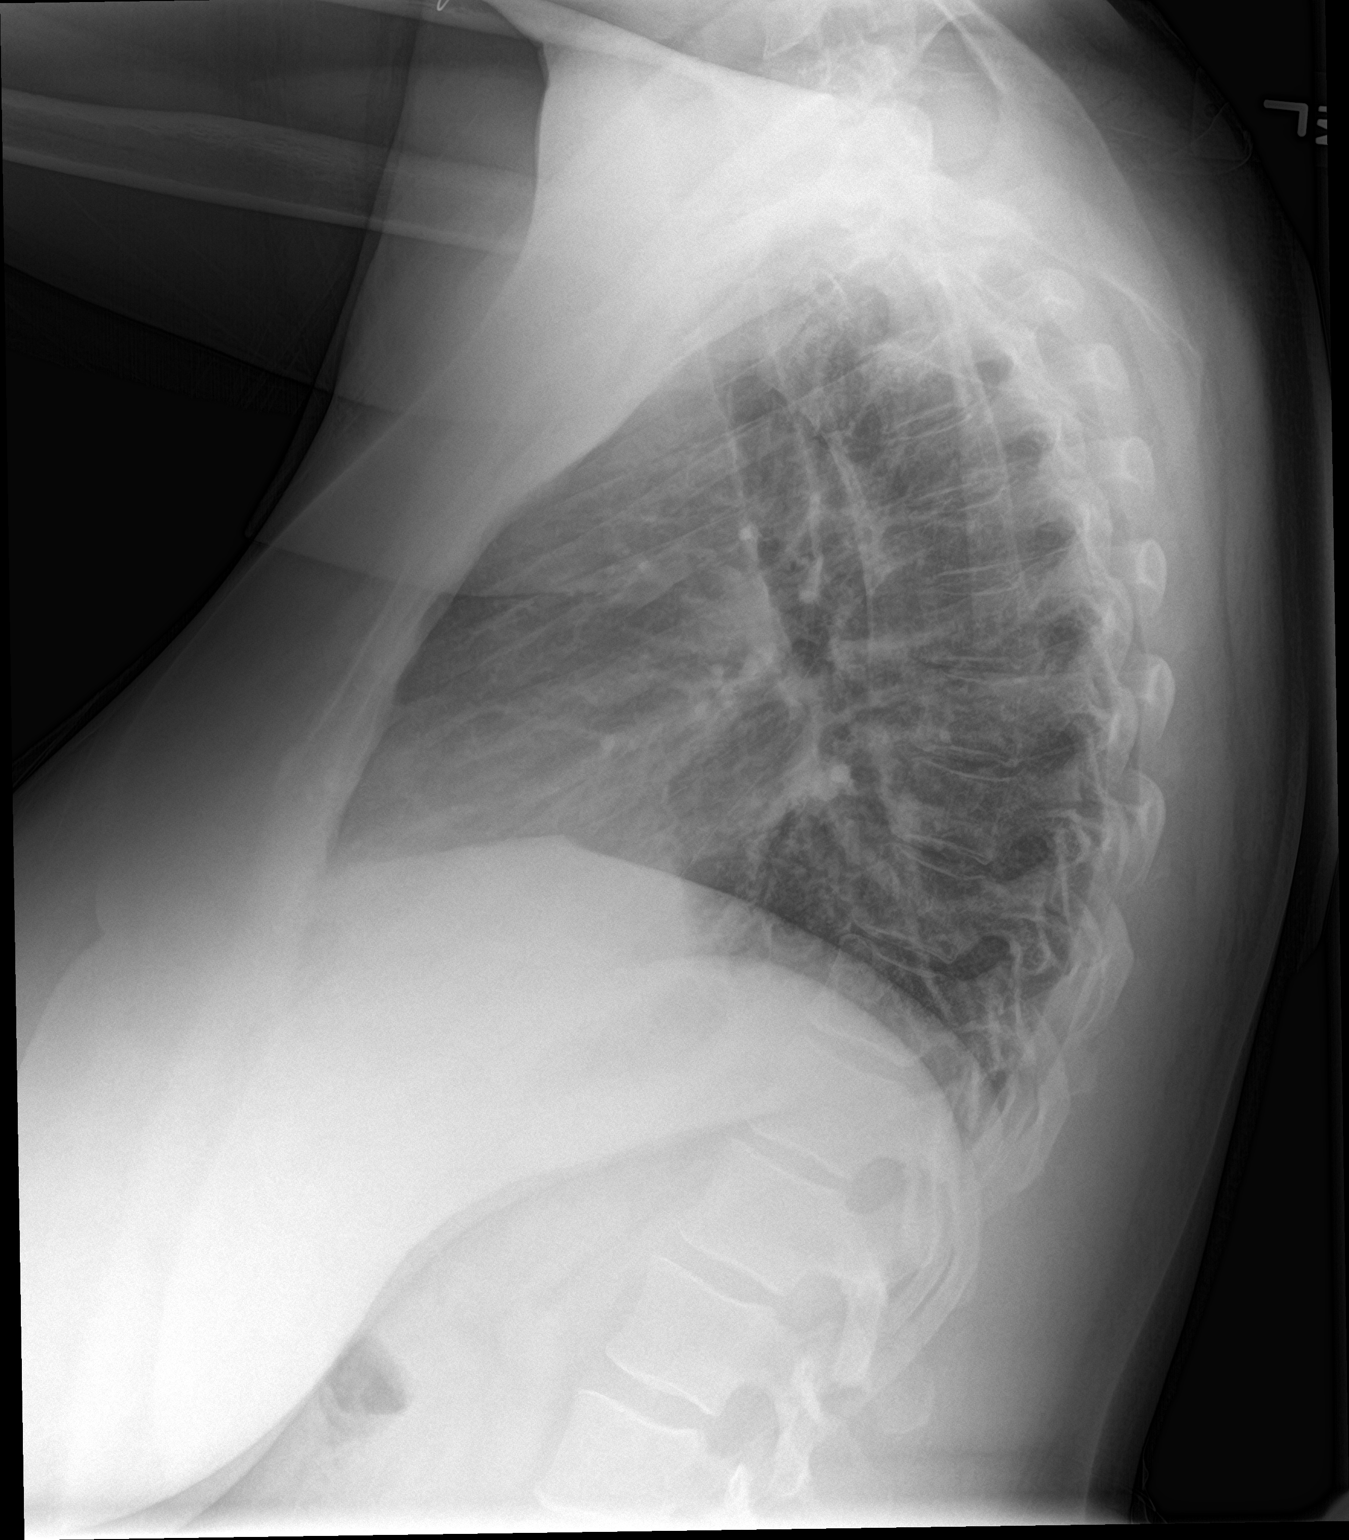

[2 of 2 positions shown; findings below may reference images not displayed]

FINDINGS: The lungs are adequately inflated. There is no focal infiltrate. The
interstitial markings are mildly prominent though stable. The
cardiac silhouette is top-normal in size but also stable. The
pulmonary vascularity is not engorged. The trachea is midline. The
bony thorax exhibits no acute abnormality.
IMPRESSION: Stable mild interstitial prominence likely reflects chronic
bronchitis. There is no alveolar pneumonia nor CHF.

## 2018-05-05 ENCOUNTER — Emergency Department (HOSPITAL_BASED_OUTPATIENT_CLINIC_OR_DEPARTMENT_OTHER)
Admission: EM | Admit: 2018-05-05 | Discharge: 2018-05-05 | Disposition: A | Discharge status: Home / Self Care | Payer: BLUE CROSS/BLUE SHIELD | Attending: Emergency Medicine | Admitting: Emergency Medicine

## 2018-05-05 ENCOUNTER — Other Ambulatory Visit: Payer: Self-pay

## 2018-05-05 ENCOUNTER — Encounter (HOSPITAL_BASED_OUTPATIENT_CLINIC_OR_DEPARTMENT_OTHER): Payer: Self-pay

## 2018-05-05 DIAGNOSIS — J069 Acute upper respiratory infection, unspecified: Secondary | ICD-10-CM | POA: Insufficient documentation

## 2018-05-05 DIAGNOSIS — I10 Essential (primary) hypertension: Secondary | ICD-10-CM | POA: Insufficient documentation

## 2018-05-05 NOTE — ED Provider Notes (Signed)
[facility] EMERGENCY DEPARTMENT Provider Note  CSN: [id number] Arrival date & time: 05/05/18  1913  History   Chief Complaint Chief Complaint  Patient presents with  . Generalized Body Aches    HPI Sonia Morris is a 32 y.o. female with a medical history of HTN, pseudoseizures and migraines who presented to the ED for myalgias x2 days. Associated symptoms: congestion and hoarse voice. 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 Denies fever, headache, abdominal pain, nausea/vomiting, sore throat, chest pain or SOB. Patient has tried ibuprofen prior to coming to the ED.  Past Medical History:  Diagnosis Date  . Agenesis of uterus   . Hypertension   . Migraine   . 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 Vaginal bleeding 07/16/2013  . BV (bacterial vaginosis) 07/16/2013  . General medical examination 05/17/2011  . 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 Cystocele 04/12/2011  . Migraines 04/12/2011    Past Surgical History:  Procedure Laterality Date  . NO PAST SURGERIES       OB History   None      Home Medications    Prior to Admission medications   Medication Sig Start Date End Date Taking? Authorizing Provider  benzonatate (TESSALON) 100 MG capsule Take 1 capsule (100 mg total) by mouth 3 (three) times daily as needed for cough. 03/17/17   Fawze, Mina A, PA-C  fluticasone (FLONASE) 50 MCG/ACT nasal spray Place 2 sprays into both nostrils daily. 03/17/17   Jeanie Sewer, PA-C    Family History Family History  Problem Relation Age of Onset  . Hypertension Mother     Social History Social History   Tobacco Use  . Smoking status: Never Smoker  . Smokeless tobacco: Never Used  Substance Use Topics  . Alcohol use: Yes    Comment: occ  . Drug use: No     Allergies   Vicodin [hydrocodone-acetaminophen]   Review of Systems Review of Systems  Constitutional: Negative for activity  change, chills and fever.  HENT: Positive for congestion and voice change. Negative for ear discharge, ear pain, postnasal drip, rhinorrhea, sinus pressure, sinus pain and sore throat.   Eyes: Negative.   Respiratory: Negative.   Cardiovascular: Negative.   Gastrointestinal: Negative.   Skin: Negative.      Physical Exam Updated Vital Signs BP 131/88 (BP Location: Left Arm)   Pulse 84   Temp 98.4 F (36.9 C) (Oral)   Resp 18   Wt 110.8 kg (244 lb 4 oz)   SpO2 100%   BMI 36.07 kg/m   Physical Exam  Constitutional: She appears well-developed and well-nourished.  HENT:  Head: Normocephalic and atraumatic.  Right Ear: External ear normal.  Left Ear: External ear normal.  Nose: Right sinus exhibits no maxillary sinus tenderness and no frontal sinus tenderness. Left sinus exhibits no maxillary sinus tenderness and no frontal sinus tenderness.  Mouth/Throat: Uvula is midline, oropharynx is clear and moist and mucous membranes are normal. No oropharyngeal exudate, posterior oropharyngeal erythema or tonsillar abscesses. No tonsillar exudate.  Unable to visualize TMs due to excessive cerumen.  Eyes: Pupils are equal, round, and reactive to light. Conjunctivae, EOM and lids are normal.  Cardiovascular: Normal rate, regular rhythm and normal heart sounds. Exam reveals no friction rub.  No murmur heard. Pulmonary/Chest: Effort normal and breath sounds normal.  Nursing note and vitals reviewed.  ED Treatments / Results  Labs (all labs ordered are listed, but only abnormal results are displayed) Labs Reviewed - No data to display  EKG None  Radiology No results found.  Procedures Procedures (including critical care time)  Medications Ordered in ED Medications - No data to display   Initial Impression / Assessment and Plan / ED Course  Triage vital signs and the nursing notes have been reviewed.  Pertinent labs & imaging results that were available during care of the  patient were reviewed and considered in medical decision making (see chart for details).   Patient presents in no acute distress with normal vital signs. Patient's symptoms consistent with viral etiology for URI. No systemic s/s to suggest bacteria infection or the need for antibiotics today. Patient's symptoms not consistent with flu and patient is beyond 48 hour window to initiate Tamiflu for alleviation of symptoms. She has been using OTC medications for symptom relief which she is encouraged to continue doing.  Final Clinical Impressions(s) / ED Diagnoses   Dispo: Home. After thorough clinical evaluation, this patient is determined to be medically stable and can be safely discharged with the previously mentioned treatment and/or outpatient follow-up/referral(s). At this time, there are no other apparent medical conditions that require further screening, evaluation or treatment.  Final diagnoses:  Viral upper respiratory tract infection    ED Discharge Orders    None        Reva BoresMortis, Gabrielle I, PA-C 05/05/18 2136    Melene PlanFloyd, Dan, DO 05/05/18 2248

## 2018-05-05 NOTE — ED Notes (Signed)
Pt. Said she has just been feeling bad and has had body aches for 2 days with no fevers and no cough.  Pt. In no distress.

## 2018-05-05 NOTE — Discharge Instructions (Addendum)
You may continue using OTC products like Tylenol, Ibuprofen and decongestant for symptom relief. No need for antibiotics today.

## 2018-05-05 NOTE — ED Triage Notes (Signed)
C/o body aches day 3-denies fever-NAD-steady gait

## 2018-09-05 ENCOUNTER — Encounter (HOSPITAL_BASED_OUTPATIENT_CLINIC_OR_DEPARTMENT_OTHER): Payer: Self-pay | Admitting: Emergency Medicine

## 2018-09-05 ENCOUNTER — Emergency Department (HOSPITAL_BASED_OUTPATIENT_CLINIC_OR_DEPARTMENT_OTHER)
Admission: EM | Admit: 2018-09-05 | Discharge: 2018-09-05 | Disposition: A | Discharge status: Home / Self Care | Payer: Self-pay | Attending: Emergency Medicine | Admitting: Emergency Medicine

## 2018-09-05 ENCOUNTER — Emergency Department (HOSPITAL_BASED_OUTPATIENT_CLINIC_OR_DEPARTMENT_OTHER): Payer: Self-pay

## 2018-09-05 ENCOUNTER — Other Ambulatory Visit: Payer: Self-pay

## 2018-09-05 DIAGNOSIS — J181 Lobar pneumonia, unspecified organism: Secondary | ICD-10-CM

## 2018-09-05 DIAGNOSIS — Z79899 Other long term (current) drug therapy: Secondary | ICD-10-CM | POA: Insufficient documentation

## 2018-09-05 DIAGNOSIS — J189 Pneumonia, unspecified organism: Secondary | ICD-10-CM | POA: Insufficient documentation

## 2018-09-05 DIAGNOSIS — I10 Essential (primary) hypertension: Secondary | ICD-10-CM | POA: Insufficient documentation

## 2018-09-05 MED ORDER — DOXYCYCLINE HYCLATE 100 MG PO CAPS: 100.0000 mg | ORAL_CAPSULE | Freq: Two times a day (BID) | ORAL | 0 refills | Status: AC

## 2018-09-05 MED ORDER — BENZONATATE 100 MG PO CAPS: 100.0000 mg | ORAL_CAPSULE | Freq: Three times a day (TID) | ORAL | 0 refills | Status: DC

## 2018-09-05 NOTE — ED Provider Notes (Signed)
[facility] EMERGENCY DEPARTMENT Provider Note   CSN: [id number] Arrival date & time: 09/05/18  1517     History   Chief Complaint Chief Complaint  Patient presents with  . Cough    HPI Sonia Morris is a 32 y.o. female with a past medical history of hypertension, who presents today for evaluation of 1 week of cough, nasal congestion runny nose, sore throat, and generally not feeling well.  She reports that this initially started out like a cold with nasal congestion stuffy/runny nose.  She does not have any shortness of breath.  States that she is not having fevers at home.  York Spaniel initially she was doing better, however then over the past few days she got worse.  HPI  Past Medical History:  Diagnosis Date  . Agenesis of uterus   . Hypertension   . Migraine   . 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 Vaginal bleeding 07/16/2013  . BV (bacterial vaginosis) 07/16/2013  . General medical examination 05/17/2011  . 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 Cystocele 04/12/2011  . Migraines 04/12/2011    Past Surgical History:  Procedure Laterality Date  . NO PAST SURGERIES       OB History   None      Home Medications    Prior to Admission medications   Medication Sig Start Date End Date Taking? Authorizing Provider  benzonatate (TESSALON) 100 MG capsule Take 1 capsule (100 mg total) by mouth every 8 (eight) hours. 09/05/18   Cristina Gong, PA-C  doxycycline (VIBRAMYCIN) 100 MG capsule Take 1 capsule (100 mg total) by mouth 2 (two) times daily for 7 days. 09/05/18 09/12/18  Cristina Gong, PA-C  fluticasone [facility]) 50 MCG/ACT nasal spray Place 2 sprays into both nostrils daily. 03/17/17   Jeanie Sewer, PA-C    Family History Family History  Problem Relation Age of Onset  . Hypertension Mother     Social History Social History   Tobacco Use  . Smoking status: Never Smoker  .  Smokeless tobacco: Never Used  Substance Use Topics  . Alcohol use: Yes    Comment: occ  . Drug use: No     Allergies   Vicodin [hydrocodone-acetaminophen]   Review of Systems Review of Systems  Constitutional: Negative for chills and fever.  HENT: Positive for congestion, postnasal drip, rhinorrhea, sinus pressure, sinus pain and sore throat. Negative for ear discharge, ear pain, trouble swallowing and voice change.   Respiratory: Positive for cough. Negative for chest tightness, shortness of breath and wheezing.   Cardiovascular: Negative for chest pain.  Gastrointestinal: Positive for diarrhea (1 loose bowel movement). Negative for abdominal pain.  All other systems reviewed and are negative.    Physical Exam Updated Vital Signs BP (!) 130/99 (BP Location: Left Arm)   Pulse 89   Temp 99.1 F (37.3 C) (Oral)   Resp 18   Ht 5\' 9"  (1.753 m)   Wt 108.9 kg   SpO2 100%   BMI 35.44 kg/m   Physical Exam  Constitutional: She is oriented to person, place, and time. She appears well-developed and well-nourished. No distress.  HENT:  Head: Normocephalic and atraumatic.  Right Ear: External ear normal.  Left Ear: External ear normal.  Nose: Mucosal edema and rhinorrhea present. Right sinus exhibits maxillary sinus tenderness and frontal sinus tenderness. Left sinus exhibits maxillary sinus tenderness and frontal  sinus tenderness.  Mouth/Throat: Uvula is midline, oropharynx is clear and moist and mucous membranes are normal. No oropharyngeal exudate. Tonsils are 2+ on the right. Tonsils are 2+ on the left. No tonsillar exudate.  Bilateral TM occluded by cerumen.   Eyes: Conjunctivae are normal. No scleral icterus.  Neck: Normal range of motion. Neck supple.  Cardiovascular: Normal rate, regular rhythm and normal heart sounds.  No murmur heard. Pulmonary/Chest: Effort normal and breath sounds normal. No stridor. No respiratory distress. She has no wheezes.  Abdominal: Soft. She  exhibits no distension. There is no tenderness. There is no guarding.  Musculoskeletal: She exhibits no edema or deformity.  Lymphadenopathy:    She has no cervical adenopathy.  Neurological: She is alert and oriented to person, place, and time. She exhibits normal muscle tone.  Skin: Skin is warm and dry. She is not diaphoretic.  Psychiatric: She has a normal mood and affect. Her behavior is normal.  Nursing note and vitals reviewed.    ED Treatments / Results  Labs (all labs ordered are listed, but only abnormal results are displayed) Labs Reviewed - No data to display  EKG None  Radiology Dg Chest 2 View  Result Date: 09/05/2018 CLINICAL DATA:  Cough for 1 week EXAM: CHEST - 2 VIEW COMPARISON:  06/17/2017 FINDINGS: There is right lower lobe hazy airspace disease. There is no other focal parenchymal opacity. There is no pleural effusion or pneumothorax. The heart mediastinum are stable. The osseous structures are unremarkable. IMPRESSION: Hazy right lower lobe airspace disease concerning for pneumonia. Electronically Signed   By: Elige Ko   On: 09/05/2018 15:52    Procedures Procedures (including critical care time)  Medications Ordered in ED Medications - No data to display   Initial Impression / Assessment and Plan / ED Course  I have reviewed the triage vital signs and the nursing notes.  Pertinent labs & imaging results that were available during my care of the patient were reviewed by me and considered in my medical decision making (see chart for details).    Patient presents today for evaluation of cough, nasal congestion, sinus pressure, postnasal drainage and generally not feeling well since last Sunday.  Chest x-ray was obtained showing hazy right lower lobe pneumonia.  Patient is not tachycardic or tachypneic, she is satting at 100% on room air.  She is generally well-appearing.  She not have any significant medical problems.  Given presence of what sounds like a  viral URI prior to onset of pneumonia, feel the infiltrate is consistent with pneumonia rather than PE or other cause.  Discussed option of blood work with patient, which she declined.  She will be treated with doxycycline, given continued sinus symptoms to allow for coverage of both pneumonia and possible bacterial sinus infection.  She is given Tessalon for cough.  Return precautions were discussed with patient who states their understanding.  At the time of discharge patient denied any unaddressed complaints or concerns.  Patient is agreeable for discharge home.   Final Clinical Impressions(s) / ED Diagnoses   Final diagnoses:  Community acquired pneumonia of right lower lobe of lung (HCC)    ED Discharge Orders         Ordered    doxycycline (VIBRAMYCIN) 100 MG capsule  2 times daily     09/05/18 1737    benzonatate (TESSALON) 100 MG capsule  Every 8 hours     11 /02/19 1737  Cristina Gong, PA-C 09/06/18 Mariann Laster    Arby Barrette, MD 09/07/18 [phone]

## 2018-09-05 NOTE — Discharge Instructions (Addendum)
Please take Ibuprofen (Advil, motrin) and Tylenol (acetaminophen) to relieve your pain.  You may take up to 600 MG (3 pills) of normal strength ibuprofen every 8 hours as needed.  In between doses of ibuprofen you make take tylenol, up to 1,000 mg (two extra strength pills).  Do not take more than 3,000 mg tylenol in a 24 hour period.  Please check all medication labels as many medications such as pain and cold medications may contain tylenol.  Do not drink alcohol while taking these medications.  Do not take other NSAID'S while taking ibuprofen (such as aleve or naproxen).  Please take ibuprofen with food to decrease stomach upset.  You may have diarrhea from the antibiotics.  It is very important that you continue to take the antibiotics even if you get diarrhea unless a medical professional tells you that you may stop taking them.  If you stop too early the bacteria you are being treated for will become stronger and you may need different, more powerful antibiotics that have more side effects and worsening diarrhea.  Please stay well hydrated and consider probiotics as they may decrease the severity of your diarrhea.  Please be aware that if you take any hormonal contraception (birth control pills, nexplanon, the ring, etc) that your birth control will not work while you are taking antibiotics and you need to use back up protection as directed on the birth control medication information insert.   If you have new concerns or worsening symptoms please seek additional medical care and evaluation.

## 2018-09-05 NOTE — ED Triage Notes (Signed)
Cough and congestion x 1 week 

## 2018-09-14 IMAGING — DX DG CHEST 2V
2 series · 2 of 2 positions shown · non-contrast
Comparison: Chest radiograph 12/10/2016

CLINICAL DATA: Chest pain

EXAM:
CHEST  2 VIEW

[chest pa]
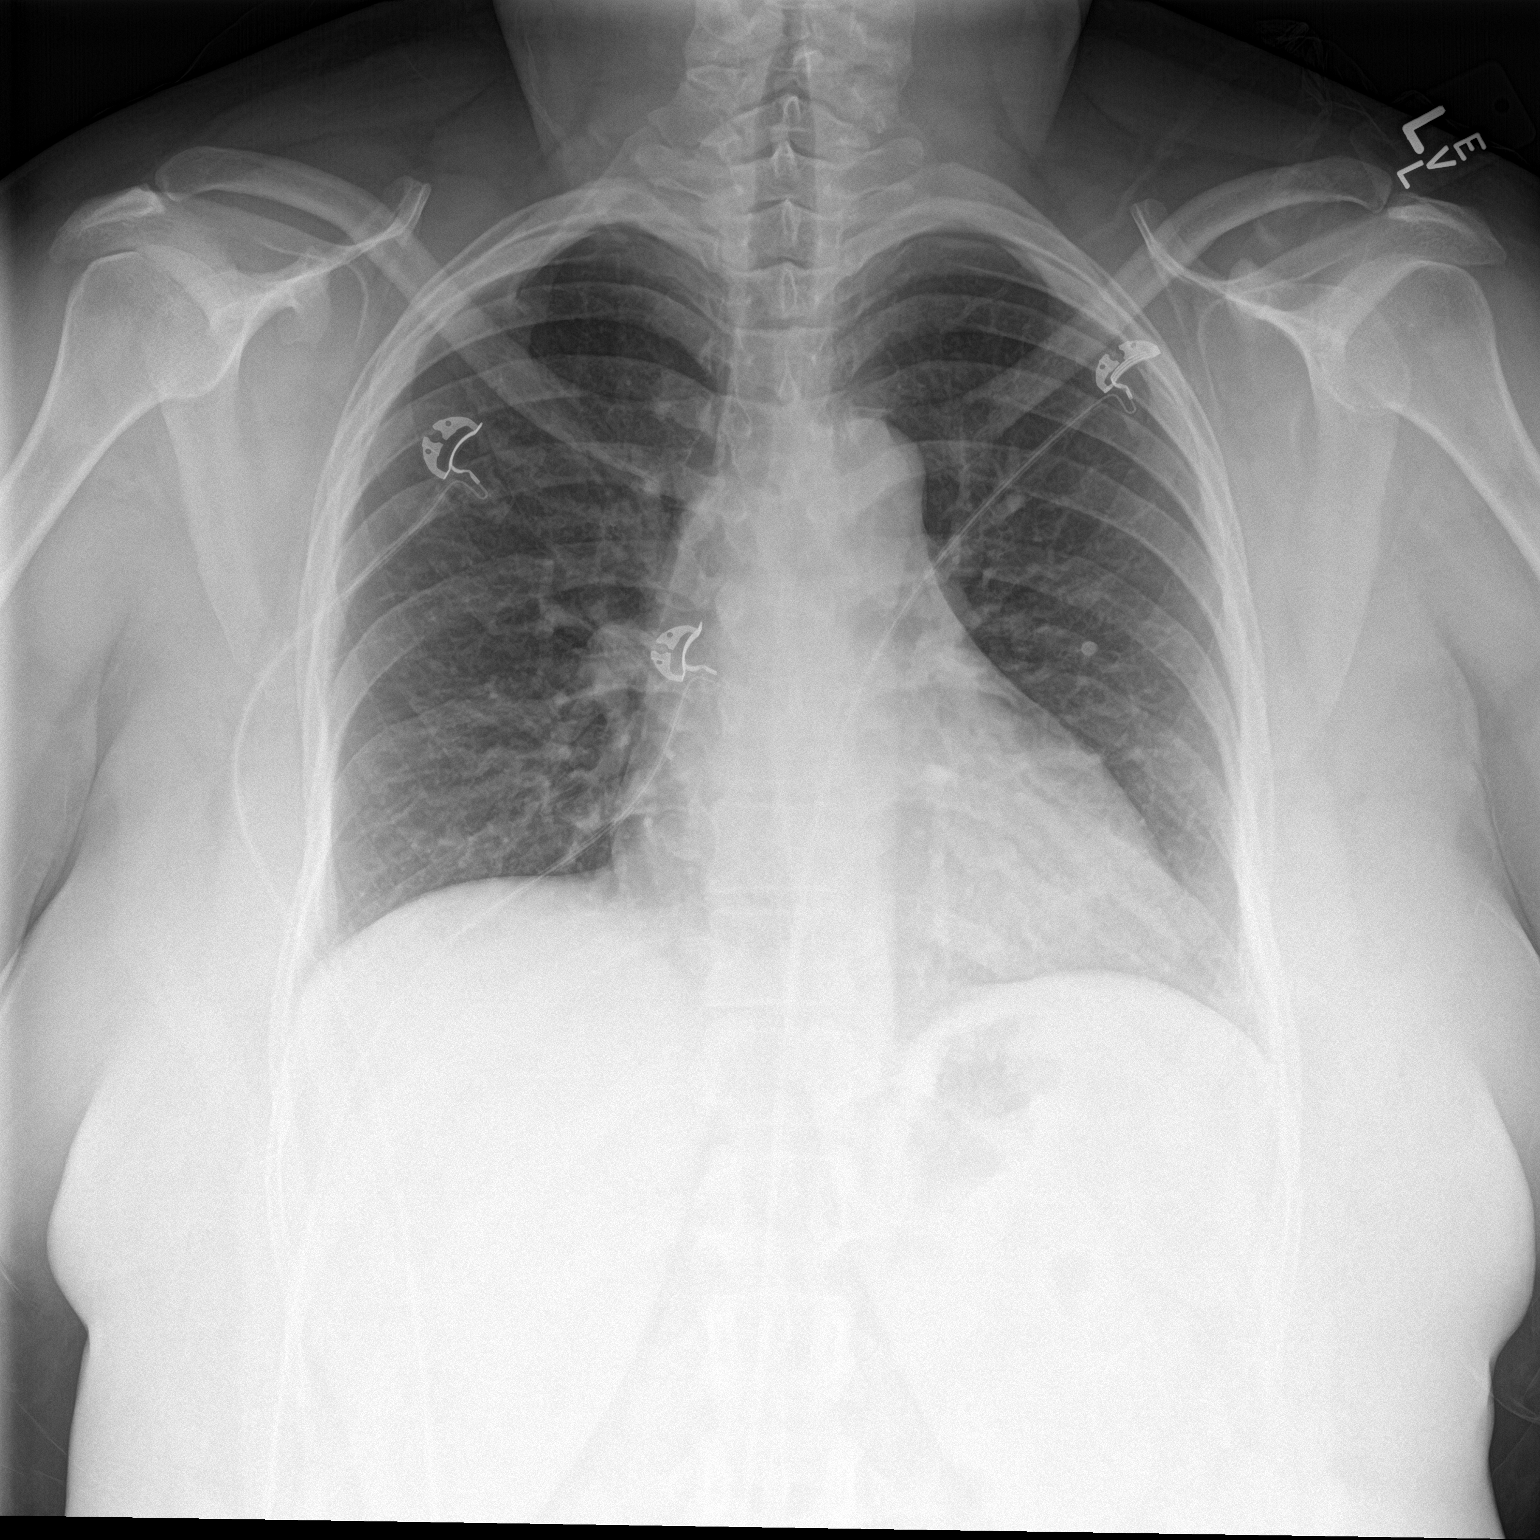

[chest lat]
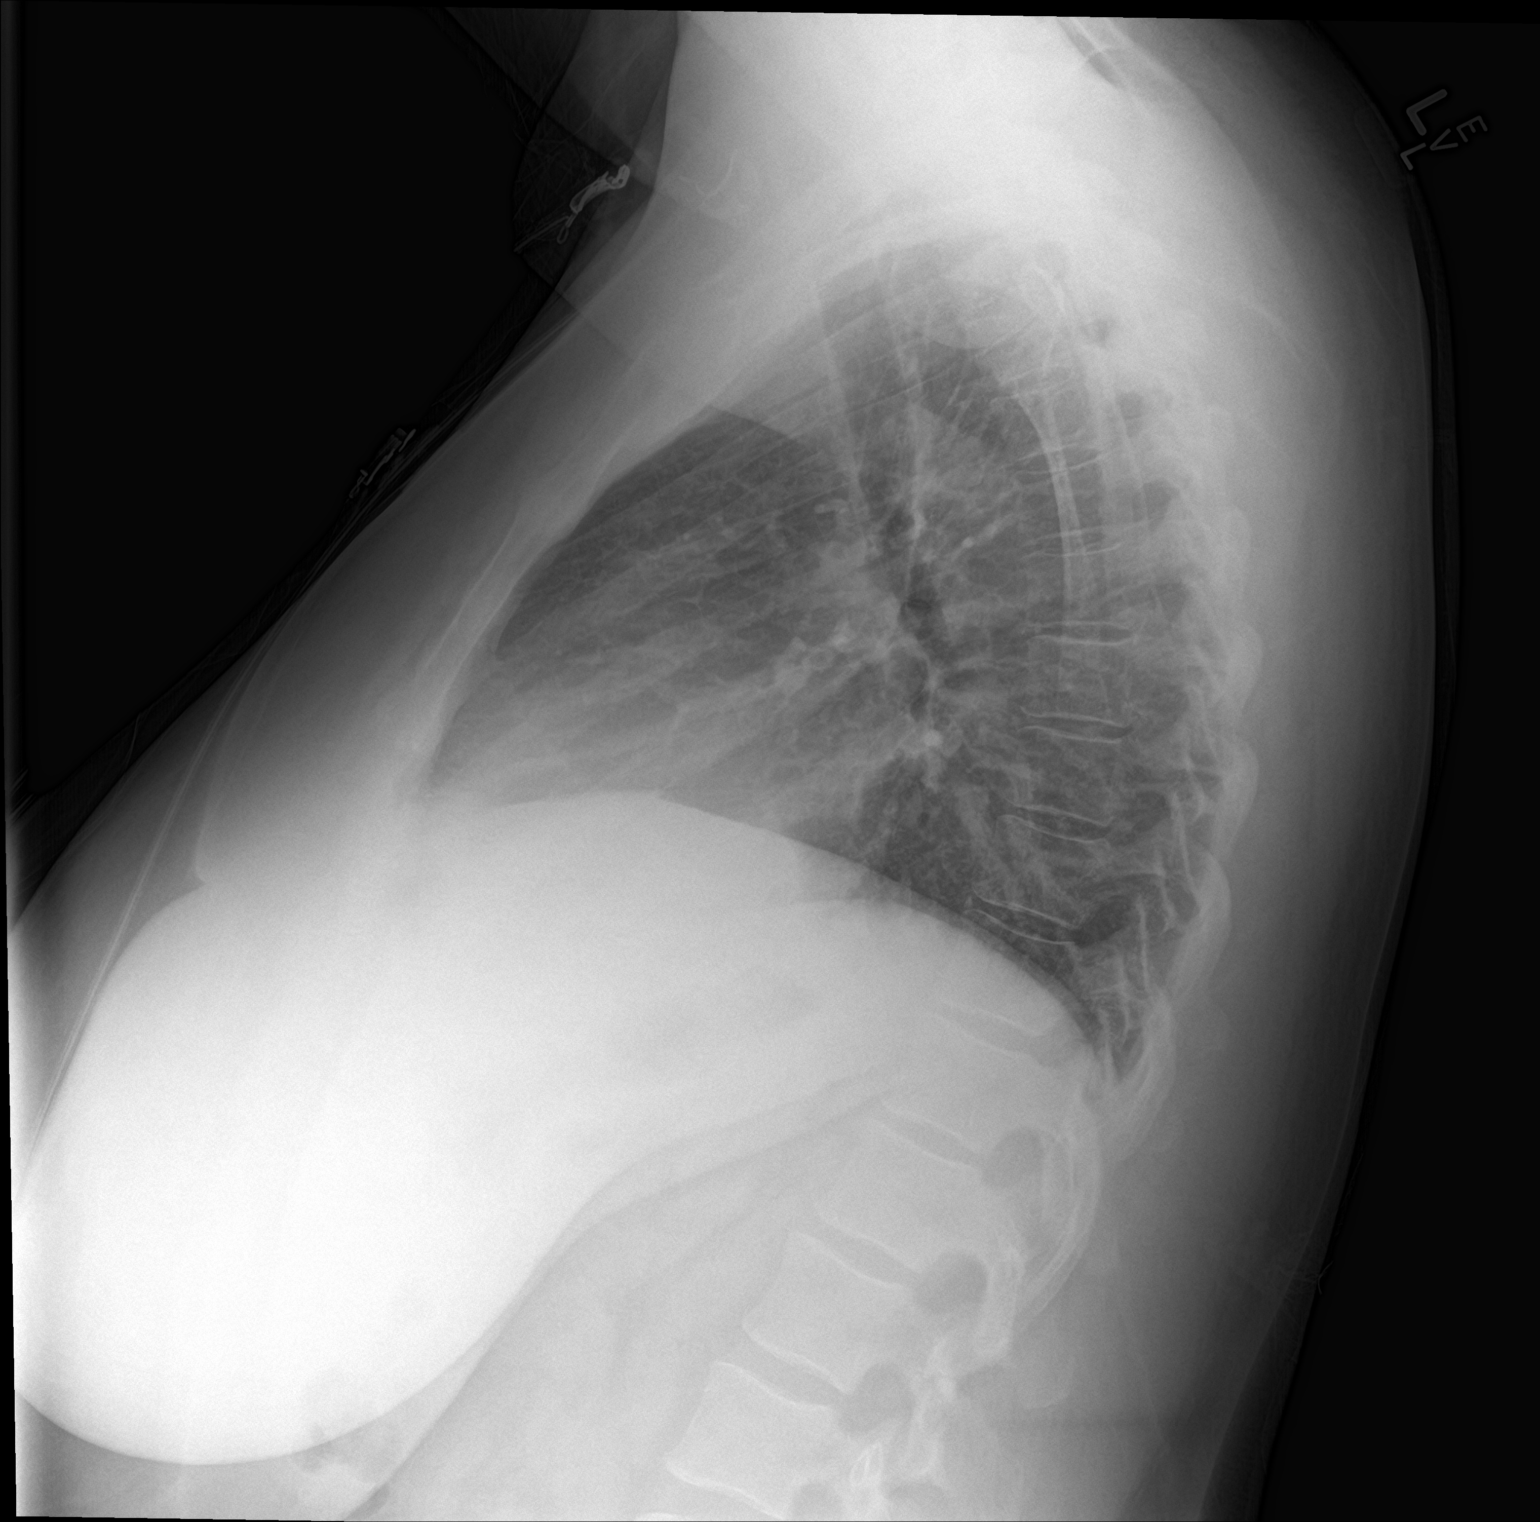

[2 of 2 positions shown; findings below may reference images not displayed]

FINDINGS: The heart size and mediastinal contours are within normal limits.
Both lungs are clear. The visualized skeletal structures are
unremarkable.
IMPRESSION: No active cardiopulmonary disease.

## 2018-12-08 ENCOUNTER — Encounter (HOSPITAL_COMMUNITY): Payer: Self-pay | Admitting: Emergency Medicine

## 2018-12-08 ENCOUNTER — Emergency Department (HOSPITAL_COMMUNITY): Payer: 59

## 2018-12-08 ENCOUNTER — Emergency Department (HOSPITAL_COMMUNITY)
Admission: EM | Admit: 2018-12-08 | Discharge: 2018-12-08 | Disposition: A | Discharge status: Home / Self Care | Payer: 59 | Attending: Emergency Medicine | Admitting: Emergency Medicine

## 2018-12-08 DIAGNOSIS — R072 Precordial pain: Secondary | ICD-10-CM | POA: Diagnosis not present

## 2018-12-08 DIAGNOSIS — R079 Chest pain, unspecified: Secondary | ICD-10-CM | POA: Diagnosis present

## 2018-12-08 DIAGNOSIS — I10 Essential (primary) hypertension: Secondary | ICD-10-CM | POA: Diagnosis not present

## 2018-12-08 LAB — CBC
HCT: 39.3 % (ref 36.0–46.0)
Hemoglobin: 11.5 g/dL — ABNORMAL LOW (ref 12.0–15.0)
MCH: 22.4 pg — ABNORMAL LOW (ref 26.0–34.0)
MCHC: 29.3 g/dL — ABNORMAL LOW (ref 30.0–36.0)
MCV: 76.6 fL — AB (ref 80.0–100.0)
PLATELETS: 313 10*3/uL (ref 150–400)
RBC: 5.13 MIL/uL — ABNORMAL HIGH (ref 3.87–5.11)
RDW: 14.5 % (ref 11.5–15.5)
WBC: 8.6 10*3/uL (ref 4.0–10.5)
nRBC: 0 % (ref 0.0–0.2)

## 2018-12-08 LAB — BASIC METABOLIC PANEL
Anion gap: 8 (ref 5–15)
BUN: 14 mg/dL (ref 6–20)
CO2: 23 mmol/L (ref 22–32)
Calcium: 9.2 mg/dL (ref 8.9–10.3)
Chloride: 107 mmol/L (ref 98–111)
Creatinine, Ser: 0.68 mg/dL (ref 0.44–1.00)
GFR calc Af Amer: >60 mL/min (ref >60)
GFR calc non Af Amer: >60 mL/min (ref >60)
Glucose, Bld: 87 mg/dL (ref 70–99)
Potassium: 4.2 mmol/L (ref 3.5–5.1)
Sodium: 138 mmol/L (ref 135–145)

## 2018-12-08 LAB — I-STAT BETA HCG BLOOD, ED (MC, WL, AP ONLY): I-stat hCG, quantitative: <5 m[IU]/mL (ref <5)

## 2018-12-08 LAB — I-STAT TROPONIN, ED: Troponin i, poc: 0 ng/mL (ref 0.00–0.08)

## 2018-12-08 MED ORDER — KETOROLAC TROMETHAMINE 30 MG/ML IJ SOLN
30.0000 mg | Freq: Once | INTRAMUSCULAR | Status: AC
  Administered 2018-12-08: 30 mg via INTRAVENOUS
  Filled 2018-12-08: qty 1

## 2018-12-08 MED ORDER — SODIUM CHLORIDE 0.9% FLUSH: 3.0000 mL | Freq: Once | INTRAVENOUS | Status: DC

## 2018-12-08 NOTE — Discharge Instructions (Addendum)

## 2018-12-08 NOTE — ED Provider Notes (Signed)
[facility]   CSN: [id number] Arrival date & time: 12/08/18  1559     History   Chief Complaint Chief Complaint  Patient presents with  . Chest Pain    HPI Sonia Morris is a 33 y.o. female.  The history is provided by the patient.  Chest Pain  Pain location:  Substernal area Pain quality: aching and tightness   Pain radiates to:  Does not radiate Pain severity:  Moderate Onset quality:  Sudden Timing:  Constant Progression:  Improving Chronicity:  New Relieved by:  Rest Worsened by:  Movement, deep breathing and certain positions Ineffective treatments:  None tried Associated symptoms: diaphoresis   Associated symptoms: no abdominal pain, no back pain, no cough, no fever, no lower extremity edema, no shortness of breath and no vomiting   Risk factors: hypertension   Risk factors: no birth control, no coronary artery disease, no prior DVT/PE and no smoking   Patient presents with chest pain.  She reports she was feeling well, was driving to work when she had onset of chest tightness.  She reports diaphoresis, but denies shortness of breath.  No pleuritic pain.  No hemoptysis. She has not had this recently.  She had otherwise been well.  No recent flulike illness. She is not on oral contraceptives   Past Medical History:  Diagnosis Date  . Agenesis of uterus   . Hypertension   . Migraine   . 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 Vaginal bleeding 07/16/2013  . BV (bacterial vaginosis) 07/16/2013  . General medical examination 05/17/2011  . 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 Cystocele 04/12/2011  . Migraines 04/12/2011    Past Surgical History:  Procedure Laterality Date  . NO PAST SURGERIES       OB History   No obstetric history on file.      Home Medications    Prior to Admission medications   Not on File    Family History Family  History  Problem Relation Age of Onset  . Hypertension Mother     Social History Social History   Tobacco Use  . Smoking status: Never Smoker  . Smokeless tobacco: Never Used  Substance Use Topics  . Alcohol use: Yes    Comment: occ  . Drug use: No     Allergies   Vicodin [hydrocodone-acetaminophen]   Review of Systems Review of Systems  Constitutional: Positive for diaphoresis. Negative for fever.  Respiratory: Negative for cough and shortness of breath.   Cardiovascular: Positive for chest pain. Negative for leg swelling.  Gastrointestinal: Negative for abdominal pain and vomiting.  Musculoskeletal: Negative for back pain.  All other systems reviewed and are negative.    Physical Exam Updated Vital Signs BP (!) 153/105 (BP Location: Right Arm)   Pulse 79   Temp 98.6 F (37 C) (Oral)   Resp 17   LMP  (LMP Unknown) Comment: no cycle  SpO2 99%   Physical Exam  CONSTITUTIONAL: Well developed/well nourished HEAD: Normocephalic/atraumatic EYES: EOMI/PERRL ENMT: Mucous membranes moist NECK: supple no meningeal signs SPINE/BACK:entire spine nontender CV: S1/S2 noted, no murmurs/rubs/gallops noted LUNGS: Lungs are clear to auscultation bilaterally, no apparent distress Chest - diffuse left sided chest wall tenderness, no crepitus noted. ABDOMEN: soft, nontender, no rebound or guarding, bowel sounds noted throughout abdomen GU:no cva tenderness NEURO: Pt is awake/alert/appropriate, moves all extremitiesx4.  No facial droop.   EXTREMITIES: pulses normal/equal, full ROM, no LE edema/tenderness SKIN: warm, color normal PSYCH: no abnormalities of mood noted, alert and oriented to situation  ED Treatments / Results  Labs (all labs ordered are listed, but only abnormal results are displayed) Labs Reviewed  CBC - Abnormal; Notable for the following components:      Result Value   RBC 5.13 (*)    Hemoglobin 11.5 (*)    MCV 76.6 (*)    MCH 22.4 (*)    MCHC 29.3  (*)    All other components within normal limits  BASIC METABOLIC PANEL  I-STAT TROPONIN, ED  I-STAT BETA HCG BLOOD, ED ([facility], [facility], [facility] ONLY)    EKG EKG Interpretation  Date/Time:  Tuesday December 08 2018 16:03:28 EST Ventricular Rate:  90 PR Interval:  152 QRS Duration: 86 QT Interval:  370 QTC Calculation: 452 R Axis:   59 Text Interpretation:  Sinus rhythm with Premature atrial complexes with Abberant conduction Otherwise normal ECG Confirmed by Tilden Fossa [phone]) on 12/08/2018 10:51:00 PM   Radiology Dg Chest 2 View  Result Date: 12/08/2018 CLINICAL DATA:  Pt began having sharp chest pains earlier today and is still having them - no other symptoms - hx of nonsmoker, no known heart or lung issues EXAM: CHEST - 2 VIEW COMPARISON:  09/05/2018 FINDINGS: Heart size is UPPER limits normal. There are no focal consolidations or pleural effusions. No pulmonary edema. IMPRESSION: No active cardiopulmonary disease. Electronically Signed   By: Norva Pavlov M.D.   On: 12/08/2018 16:24    Procedures Procedures   Medications Ordered in ED Medications  sodium chloride flush (NS) 0.9 % injection 3 mL (3 mLs Intravenous Not Given 12/08/18 2236)  ketorolac (TORADOL) 30 MG/ML injection 30 mg (30 mg Intravenous Given 12/08/18 2325)     Initial Impression / Assessment and Plan / ED Course  I have reviewed the triage vital signs and the nursing notes.  Pertinent labs & imaging results that were available during my care of the patient were reviewed by me and considered in my medical decision making (see chart for details).     Heart score 1 Pt well appearing Appears PERC negative She has h/o untreated HTN, advised f/u as outpatient CP is clearly reproducible prehospital EKG reviewed Will d/c home  Final Clinical Impressions(s) / ED Diagnoses   Final diagnoses:  Precordial pain    ED Discharge Orders    None       Zadie Rhine, MD 12/08/18 2340

## 2018-12-08 NOTE — ED Notes (Signed)
Patient verbalizes understanding of discharge instructions. Opportunity for questioning and answers were provided. Armband removed by staff, pt discharged from ED ambulatory.   

## 2018-12-08 NOTE — ED Triage Notes (Signed)
BIB EMS from work. Pt reports onset of CP, central, non radiating. Worsens with movement and deep breath. No cardiac hx. Pt in NAD.

## 2018-12-08 NOTE — ED Notes (Signed)
Updated on wait for treatment room. 

## 2018-12-20 ENCOUNTER — Emergency Department (HOSPITAL_BASED_OUTPATIENT_CLINIC_OR_DEPARTMENT_OTHER)
Admission: EM | Admit: 2018-12-20 | Discharge: 2018-12-21 | Disposition: A | Discharge status: Home / Self Care | Payer: 59 | Attending: Emergency Medicine | Admitting: Emergency Medicine

## 2018-12-20 ENCOUNTER — Other Ambulatory Visit: Payer: Self-pay

## 2018-12-20 ENCOUNTER — Encounter (HOSPITAL_BASED_OUTPATIENT_CLINIC_OR_DEPARTMENT_OTHER): Payer: Self-pay

## 2018-12-20 DIAGNOSIS — I10 Essential (primary) hypertension: Secondary | ICD-10-CM | POA: Diagnosis not present

## 2018-12-20 DIAGNOSIS — R05 Cough: Secondary | ICD-10-CM | POA: Diagnosis present

## 2018-12-20 DIAGNOSIS — H1033 Unspecified acute conjunctivitis, bilateral: Secondary | ICD-10-CM

## 2018-12-20 DIAGNOSIS — J069 Acute upper respiratory infection, unspecified: Secondary | ICD-10-CM | POA: Diagnosis not present

## 2018-12-20 DIAGNOSIS — B9789 Other viral agents as the cause of diseases classified elsewhere: Secondary | ICD-10-CM | POA: Diagnosis not present

## 2018-12-20 LAB — GROUP A STREP BY PCR: GROUP A STREP BY PCR: NOT DETECTED

## 2018-12-20 NOTE — ED Triage Notes (Signed)
Pt c/o sore throat x 1 week. Yesterday pt woke up with red eyes and yellow drainage.

## 2018-12-20 NOTE — ED Notes (Signed)
Strep swab obtained by Denice Bors, RN

## 2018-12-21 MED ORDER — DEXAMETHASONE 1 MG/ML PO CONC
10.0000 mg | Freq: Once | ORAL | Status: DC
  Filled 2018-12-21: qty 10

## 2018-12-21 MED ORDER — DEXAMETHASONE 10 MG/ML FOR PEDIATRIC ORAL USE
INTRAMUSCULAR | Status: AC
  Administered 2018-12-21: 10 mg
  Filled 2018-12-21: qty 1

## 2018-12-21 MED ORDER — CIPROFLOXACIN HCL 0.3 % OP SOLN
1.0000 [drp] | OPHTHALMIC | Status: DC
  Administered 2018-12-21: 1 [drp] via OPHTHALMIC
  Filled 2018-12-21: qty 2.5

## 2018-12-21 NOTE — ED Provider Notes (Signed)
[facility] EMERGENCY DEPARTMENT Provider Note   CSN: [id number] Arrival date & time: 12/20/18  2312     History   Chief Complaint Chief Complaint  Patient presents with  . Sore Throat  . Conjunctivitis    HPI Sonia Morris is a 33 y.o. female.  Patient presents to the emergency department for cold symptoms for 1 week.  She started with sore throat 1 week ago and developed a cough.  She has not had any significant shortness of breath.  No nausea, vomiting or diarrhea.  Yesterday she started having swelling and redness of her eyes, right greater than left.  No drainage.     Past Medical History:  Diagnosis Date  . Agenesis of uterus   . Hypertension   . Migraine   . 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 Vaginal bleeding 07/16/2013  . BV (bacterial vaginosis) 07/16/2013  . General medical examination 05/17/2011  . 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 Cystocele 04/12/2011  . Migraines 04/12/2011    Past Surgical History:  Procedure Laterality Date  . NO PAST SURGERIES       OB History   No obstetric history on file.      Home Medications    Prior to Admission medications   Not on File    Family History Family History  Problem Relation Age of Onset  . Hypertension Mother     Social History Social History   Tobacco Use  . Smoking status: Never Smoker  . Smokeless tobacco: Never Used  Substance Use Topics  . Alcohol use: Yes    Comment: occ  . Drug use: No     Allergies   Vicodin [hydrocodone-acetaminophen]   Review of Systems Review of Systems  HENT: Positive for congestion and sore throat.   Eyes: Positive for redness and itching.  Respiratory: Positive for cough.   All other systems reviewed and are negative.    Physical Exam Updated Vital Signs BP (!) 149/102 (BP Location: Left Arm)   Pulse 99   Temp 99 F (37.2 C) (Oral)   Resp 20   Ht 5\' 9"  (1.753 m)    Wt 106.6 kg   LMP  (LMP Unknown) Comment: no cycle  SpO2 100%   BMI 34.70 kg/m   Physical Exam Vitals signs and nursing note reviewed.  Constitutional:      General: She is not in acute distress.    Appearance: Normal appearance. She is well-developed.  HENT:     Head: Normocephalic and atraumatic.     Right Ear: Hearing normal.     Left Ear: Hearing normal.     Nose: Nose normal.     Mouth/Throat:     Pharynx: Posterior oropharyngeal erythema present. No pharyngeal swelling or oropharyngeal exudate.     Tonsils: No tonsillar exudate or tonsillar abscesses.  Eyes:     Conjunctiva/sclera:     Right eye: Right conjunctiva is injected. No exudate.    Left eye: Left conjunctiva is injected. No exudate.    Pupils: Pupils are equal, round, and reactive to light.  Neck:     Musculoskeletal: Normal range of motion and neck supple.  Cardiovascular:     Rate and Rhythm: Regular rhythm.     Heart sounds: S1 normal and S2 normal. No murmur. No friction rub. No gallop.   Pulmonary:     Effort: Pulmonary effort is normal.  No respiratory distress.     Breath sounds: Normal breath sounds.  Chest:     Chest wall: No tenderness.  Abdominal:     General: Bowel sounds are normal.     Palpations: Abdomen is soft.     Tenderness: There is no abdominal tenderness. There is no guarding or rebound. Negative signs include Murphy's sign and McBurney's sign.     Hernia: No hernia is present.  Musculoskeletal: Normal range of motion.  Skin:    General: Skin is warm and dry.     Findings: No rash.  Neurological:     Mental Status: She is alert and oriented to person, place, and time.     GCS: GCS eye subscore is 4. GCS verbal subscore is 5. GCS motor subscore is 6.     Cranial Nerves: No cranial nerve deficit.     Sensory: No sensory deficit.     Coordination: Coordination normal.  Psychiatric:        Speech: Speech normal.        Behavior: Behavior normal.        Thought Content: Thought  content normal.      ED Treatments / Results  Labs (all labs ordered are listed, but only abnormal results are displayed) Labs Reviewed  GROUP A STREP BY PCR    EKG None  Radiology No results found.  Procedures Procedures (including critical care time)  Medications Ordered in ED Medications  ciprofloxacin (CILOXAN) 0.3 % ophthalmic solution 1 drop (has no administration in time range)  dexamethasone (DECADRON) 1 MG/ML solution 10 mg (has no administration in time range)     Initial Impression / Assessment and Plan / ED Course  I have reviewed the triage vital signs and the nursing notes.  Pertinent labs & imaging results that were available during my care of the patient were reviewed by me and considered in my medical decision making (see chart for details).       Final Clinical Impressions(s) / ED Diagnoses   Final diagnoses:  Viral URI with cough  Acute conjunctivitis of both eyes, unspecified acute conjunctivitis type    ED Discharge Orders    None       Gilda Crease, MD 12/21/18 [phone]

## 2018-12-23 ENCOUNTER — Other Ambulatory Visit: Payer: Self-pay

## 2018-12-23 ENCOUNTER — Encounter (HOSPITAL_BASED_OUTPATIENT_CLINIC_OR_DEPARTMENT_OTHER): Payer: Self-pay

## 2018-12-23 ENCOUNTER — Emergency Department (HOSPITAL_BASED_OUTPATIENT_CLINIC_OR_DEPARTMENT_OTHER)
Admission: EM | Admit: 2018-12-23 | Discharge: 2018-12-23 | Disposition: A | Discharge status: Home / Self Care | Payer: 59 | Attending: Emergency Medicine | Admitting: Emergency Medicine

## 2018-12-23 DIAGNOSIS — H10021 Other mucopurulent conjunctivitis, right eye: Secondary | ICD-10-CM | POA: Insufficient documentation

## 2018-12-23 DIAGNOSIS — H1031 Unspecified acute conjunctivitis, right eye: Secondary | ICD-10-CM

## 2018-12-23 DIAGNOSIS — I10 Essential (primary) hypertension: Secondary | ICD-10-CM | POA: Insufficient documentation

## 2018-12-23 DIAGNOSIS — H5789 Other specified disorders of eye and adnexa: Secondary | ICD-10-CM | POA: Diagnosis present

## 2018-12-23 MED ORDER — TETRACAINE HCL 0.5 % OP SOLN
1.0000 [drp] | Freq: Once | OPHTHALMIC | Status: AC
  Administered 2018-12-23: 1 [drp] via OPHTHALMIC
  Filled 2018-12-23: qty 4

## 2018-12-23 MED ORDER — CIPROFLOXACIN HCL 0.3 % OP SOLN: 1.0000 [drp] | OPHTHALMIC | 0 refills | Status: DC

## 2018-12-23 MED ORDER — FLUORESCEIN SODIUM 1 MG OP STRP
1.0000 | ORAL_STRIP | Freq: Once | OPHTHALMIC | Status: AC
  Administered 2018-12-23: 1 via OPHTHALMIC
  Filled 2018-12-23: qty 1

## 2018-12-23 MED FILL — CIPROFLOXACIN 0.3% EYE DRO: 0.3 | 17 days supply | Qty: 5 | Fill #0

## 2018-12-23 NOTE — ED Notes (Signed)
ED Provider at bedside. 

## 2018-12-23 NOTE — Discharge Instructions (Addendum)
Please read and follow all provided instructions.  Your diagnoses today include:  1. Acute bacterial conjunctivitis of right eye     Tests performed today include: Visual acuity testing to check your vision Fluorescein dye examination to look for scratches on your eye Tonometry to check the pressure inside of your eye Vital signs. See below for your results today.   Medications prescribed:   Take any prescribed medications only as directed.  Please place 1 drop in your right eye every 4 hours while awake for 2 days, then 4 times a day for remaining 5 days.  Home care instructions:  Follow any educational materials contained in this packet. If you wear contact lenses, do not use them until your eye caregiver approves. Follow-up care is necessary to be sure the infection is healing if not completely resolved in 2-3 days. See your caregiver or eye specialist as suggested for followup.   If you have an eye infection, wash your hands often as this is very contagious and is easily spread from person to person.   Follow-up instructions: Please follow-up with your primary care doctor OR the opthalmologist listed in the next 2-3 days for further evaluation of your symptoms.  Return instructions:  Please return to the Emergency Department if you experience worsening symptoms.  Please return immediately if you develop severe pain, pus drainage, new change in vision, or fever. Please return if you have any other emergent concerns.  Additional Information:  Your vital signs today were: BP (!) 136/93 (BP Location: Right Arm)    Pulse 99    Temp 98.2 F (36.8 C) (Oral)    Resp 14    Ht 5\' 9"  (1.753 m)    Wt 111.1 kg    LMP  (LMP Unknown) Comment: no uterus-congenital   SpO2 98%    BMI 36.18 kg/m  If your blood pressure (BP) was elevated above 130/80 this visit, please have this repeated by your doctor within one month. ---------------

## 2018-12-23 NOTE — ED Triage Notes (Signed)
Pt states she was seen here on 2/16-dx with "pink eye"-states sx are worse-NAD-steady gait

## 2018-12-24 NOTE — ED Provider Notes (Signed)
[facility] EMERGENCY DEPARTMENT Provider Note   CSN: [id number] Arrival date & time: 12/23/18  1522    History   Chief Complaint Chief Complaint  Patient presents with  . Follow-up    HPI Sonia Morris is a 33 y.o. female.     HPI  Patient is a 33 year old female with a history of hypertension migraines presenting for erythema and drainage of the right eye.  Patient reports that she was treated approximately 1 week ago for conjunctivitis of the left eye.  She reports that she took the full course of antibiotics.  She reports that over the past couple days she began having some irritation erythema of the right eye, and this morning there was drainage that matted her eyes shut.  Patient reports that she is on the side of the antibiotics that she completed for the left eye.  Patient is not a contact lens wear.  She denies visual disturbance.  No history of autoimmune disease. No recent eye trauma.  Past Medical History:  Diagnosis Date  . Agenesis of uterus   . Hypertension   . Migraine   . 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 Vaginal bleeding 07/16/2013  . BV (bacterial vaginosis) 07/16/2013  . General medical examination 05/17/2011  . 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 Cystocele 04/12/2011  . Migraines 04/12/2011    Past Surgical History:  Procedure Laterality Date  . NO PAST SURGERIES       OB History   No obstetric history on file.      Home Medications    Prior to Admission medications   Medication Sig Start Date End Date Taking? Authorizing Provider  ciprofloxacin (CILOXAN) 0.3 % ophthalmic solution Place 1 drop into the right eye every 4 (four) hours while awake. Administer 1 drop, every 4 hours, while awake, for 2 days. Then 1 drop 4 times per day for the next 5 days. 12/23/18   Elisha Ponder, PA-C    Family History Family History  Problem Relation Age of Onset  .  Hypertension Mother     Social History Social History   Tobacco Use  . Smoking status: Never Smoker  . Smokeless tobacco: Never Used  Substance Use Topics  . Alcohol use: Yes    Comment: occ  . Drug use: No     Allergies   Vicodin [hydrocodone-acetaminophen]   Review of Systems Review of Systems  Constitutional: Negative for chills and fever.  Eyes: Positive for discharge and redness. Negative for visual disturbance.  Allergic/Immunologic: Negative for immunocompromised state.     Physical Exam Updated Vital Signs BP (!) 136/93 (BP Location: Right Arm)   Pulse 99   Temp 98.2 F (36.8 C) (Oral)   Resp 14   Ht 5\' 9"  (1.753 m)   Wt 111.1 kg   LMP  (LMP Unknown) Comment: no uterus-congenital  SpO2 98%   BMI 36.18 kg/m   Physical Exam Vitals signs and nursing note reviewed.  Constitutional:      General: She is not in acute distress.    Appearance: She is well-developed. She is not diaphoretic.     Comments: Sitting comfortably in bed.  HENT:     Head: Normocephalic and atraumatic.  Eyes:     General:        Right eye: Discharge present.        Left eye: No discharge.  Extraocular Movements: Extraocular movements intact.     Conjunctiva/sclera: Conjunctivae normal.     Pupils: Pupils are equal, round, and reactive to light.     Comments: Right eye exhibits small conjunctival hemorrhage versus scleral injection of the nasal conjunctiva.  Patient has drainage present in eyelashes on exam.  No proptosis.  No extraocular motion entrapment.  No surrounding periorbital erythema or edema.  No pain with extraocular motions. Wood's lamp examination reveals no punctate uptake over cornea.  Neck:     Musculoskeletal: Normal range of motion.  Cardiovascular:     Rate and Rhythm: Normal rate and regular rhythm.     Comments: Intact, 2+ radial pulse. Pulmonary:     Comments: Patient converses comfortably without audible wheeze or stridor. Abdominal:     General:  There is no distension.  Musculoskeletal: Normal range of motion.  Skin:    General: Skin is warm and dry.  Neurological:     Mental Status: She is alert.     Comments: Cranial nerves intact to gross observation. Patient moves extremities without difficulty.  Psychiatric:        Behavior: Behavior normal.        Thought Content: Thought content normal.        Judgment: Judgment normal.      ED Treatments / Results  Labs (all labs ordered are listed, but only abnormal results are displayed) Labs Reviewed - No data to display  EKG None  Radiology No results found.  Procedures Procedures (including critical care time)  Medications Ordered in ED Medications  fluorescein ophthalmic strip 1 strip (1 strip Right Eye Given by Other 12/23/18 1604)  tetracaine (PONTOCAINE) 0.5 % ophthalmic solution 1 drop (1 drop Right Eye Given by Other 12/23/18 1604)     Initial Impression / Assessment and Plan / ED Course  I have reviewed the triage vital signs and the nursing notes.  Pertinent labs & imaging results that were available during my care of the patient were reviewed by me and considered in my medical decision making (see chart for details).        Patient well-appearing and in no acute distress.  Patient with conjunctival injection and drainage of the right eye.  She was recently treated for bacterial conjunctivitis of the left eye.  Patient reports that she has not gotten rid of her make-up brushes.  She is not a contact lens wear.  I discussed with the patient that anything that touches 1 I can spread to the other eye, and encouraged retreatment.  Additional antibiotics prescribed, and recommended warm compresses.  Return precautions given for any increasing pain, drainage, or periorbital edema or erythema.  Patient is in understanding and agrees with the plan of care.  Final Clinical Impressions(s) / ED Diagnoses   Final diagnoses:  Acute bacterial conjunctivitis of right  eye    ED Discharge Orders         Ordered    ciprofloxacin (CILOXAN) 0.3 % ophthalmic solution  Every 4 hours while awake     12/23/18 1629           Delia Chimes 12/24/18 0129    Azalia Bilis, MD 12/24/18 1719

## 2019-01-25 ENCOUNTER — Encounter (HOSPITAL_BASED_OUTPATIENT_CLINIC_OR_DEPARTMENT_OTHER): Payer: Self-pay | Admitting: *Deleted

## 2019-01-25 ENCOUNTER — Emergency Department (HOSPITAL_BASED_OUTPATIENT_CLINIC_OR_DEPARTMENT_OTHER)
Admission: EM | Admit: 2019-01-25 | Discharge: 2019-01-25 | Disposition: A | Discharge status: Home / Self Care | Payer: 59 | Attending: Emergency Medicine | Admitting: Emergency Medicine

## 2019-01-25 ENCOUNTER — Other Ambulatory Visit: Payer: Self-pay

## 2019-01-25 DIAGNOSIS — J310 Chronic rhinitis: Secondary | ICD-10-CM | POA: Diagnosis not present

## 2019-01-25 DIAGNOSIS — R05 Cough: Secondary | ICD-10-CM | POA: Insufficient documentation

## 2019-01-25 DIAGNOSIS — I10 Essential (primary) hypertension: Secondary | ICD-10-CM | POA: Insufficient documentation

## 2019-01-25 DIAGNOSIS — J3489 Other specified disorders of nose and nasal sinuses: Secondary | ICD-10-CM | POA: Diagnosis present

## 2019-01-25 DIAGNOSIS — R0981 Nasal congestion: Secondary | ICD-10-CM | POA: Diagnosis not present

## 2019-01-25 MED ORDER — LORATADINE 10 MG PO TABS: 10.0000 mg | ORAL_TABLET | Freq: Every day | ORAL | 0 refills | Status: DC

## 2019-01-25 MED ORDER — CARBAMIDE PEROXIDE 6.5 % OT SOLN: 5.0000 [drp] | Freq: Two times a day (BID) | OTIC | 0 refills | Status: AC

## 2019-01-25 MED ORDER — FLUTICASONE PROPIONATE 50 MCG/ACT NA SUSP: 2.0000 | Freq: Every day | NASAL | 0 refills | Status: DC

## 2019-01-25 MED FILL — LORATADINE 10 MG TABLET: 10 | 100 days supply | Qty: 100 | Fill #0

## 2019-01-25 MED FILL — FLUTICASONE PROP 50 MCG SPR: 50 | 30 days supply | Qty: 16 | Fill #0

## 2019-01-25 MED FILL — DEBROX 6.5 % SOLN: 6.5 | 30 days supply | Qty: 15 | Fill #0

## 2019-01-25 NOTE — ED Triage Notes (Signed)
Pt c/o sinus congestion and bil ear pain x 4 days

## 2019-01-25 NOTE — Discharge Instructions (Signed)
Please take medications as directed.  Please follow up with your primary care provider within 5-7 days for re-evaluation of your symptoms. If you do not have a primary care provider, information for a healthcare clinic has been provided for you to make arrangements for follow up care. Please return to the emergency department for any new or worsening symptoms.  Do not go to work if you develop fevers or cough. If you do develop these symptoms do not return to work for 7 days after symptom onset or >72 hours after symptom resolution without medications.

## 2019-01-25 NOTE — ED Provider Notes (Signed)
[facility] EMERGENCY DEPARTMENT Provider Note   CSN: [id number] Arrival date & time: 01/25/19  1243    History   Chief Complaint Chief Complaint  Patient presents with  . Nasal Congestion    HPI Sonia Morris is a 33 y.o. female.     HPI   Pt is a 33 y/o female with a HTN, migraines, who presents to the ED today for evaluation of rhinorrhea, congestion that began about 5 days ago. No significant sore throat. Only has mild cough that is only at night. States she thinks she has a sinus infection. Has had bilat ear fullness. No significant ear pain.   No itchy/watery eyes or sneezing though does not she has h/o allergies and sxs started after warm weather recently.   No sob or chest pain. Denies any fevers, body aches, chills.   Denies any sick contacts. No recent travel including no foreign travel. No known contacts with covid.   She has been taking benadryl with temporary relief.   Past Medical History:  Diagnosis Date  . Agenesis of uterus   . Hypertension   . Migraine   . 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 Vaginal bleeding 07/16/2013  . BV (bacterial vaginosis) 07/16/2013  . General medical examination 05/17/2011  . 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 Cystocele 04/12/2011  . Migraines 04/12/2011    Past Surgical History:  Procedure Laterality Date  . NO PAST SURGERIES       OB History   No obstetric history on file.      Home Medications    Prior to Admission medications   Medication Sig Start Date End Date Taking? Authorizing Provider  carbamide peroxide (DEBROX) 6.5 % OTIC solution Place 5 drops into the right ear 2 (two) times daily for 3 days. 01/25/19 01/28/19  Makaylia Hewett S, PA-C  ciprofloxacin (CILOXAN) 0.3 % ophthalmic solution Place 1 drop into the right eye every 4 (four) hours while awake. Administer 1 drop, every 4 hours, while awake, for 2 days. Then 1 drop 4  times per day for the next 5 days. 12/23/18   Aviva Kluver B, PA-C  fluticasone (FLONASE) 50 MCG/ACT nasal spray Place 2 sprays into both nostrils daily. 01/25/19   Ernestina Joe S, PA-C  loratadine (CLARITIN) 10 MG tablet Take 1 tablet (10 mg total) by mouth daily for 30 days. 01/25/19 02/24/19  Sheridan Gettel S, PA-C    Family History Family History  Problem Relation Age of Onset  . Hypertension Mother     Social History Social History   Tobacco Use  . Smoking status: Never Smoker  . Smokeless tobacco: Never Used  Substance Use Topics  . Alcohol use: Yes    Comment: occ  . Drug use: No     Allergies   Vicodin [hydrocodone-acetaminophen]   Review of Systems Review of Systems  Constitutional: Negative for fever.  HENT: Positive for congestion and rhinorrhea.        Ear fullness  Eyes: Negative for visual disturbance.  Respiratory: Positive for cough (rare). Negative for shortness of breath.   Cardiovascular: Negative for chest pain.  Gastrointestinal: Negative for abdominal pain, constipation, diarrhea, nausea and vomiting.  Genitourinary: Negative for pelvic pain.  Musculoskeletal: Negative for back pain.  Neurological: Negative for headaches.     Physical Exam Updated Vital Signs BP (!) 145/97   Pulse (!) 102   Temp  98.9 F (37.2 C) (Oral)   Resp 18   Ht 5\' 9"  (1.753 m)   Wt 111.1 kg   SpO2 100%   BMI 36.18 kg/m   Physical Exam Vitals signs and nursing note reviewed.  Constitutional:      General: She is not in acute distress.    Appearance: She is well-developed. She is obese. She is not ill-appearing or toxic-appearing.  HENT:     Head: Normocephalic and atraumatic.     Right Ear: There is impacted cerumen.     Left Ear: Tympanic membrane normal.     Nose: Congestion present.     Comments: Swollen turbinates bilat    Mouth/Throat:     Mouth: Mucous membranes are moist.     Pharynx: No oropharyngeal exudate or posterior oropharyngeal erythema.   Eyes:     Conjunctiva/sclera: Conjunctivae normal.  Neck:     Musculoskeletal: Neck supple.  Cardiovascular:     Rate and Rhythm: Normal rate and regular rhythm.     Pulses: Normal pulses.     Heart sounds: Normal heart sounds. No murmur.  Pulmonary:     Effort: Pulmonary effort is normal. No respiratory distress.     Breath sounds: Normal breath sounds. No stridor. No wheezing, rhonchi or rales.  Abdominal:     Palpations: Abdomen is soft.     Tenderness: There is no abdominal tenderness.  Lymphadenopathy:     Cervical: Cervical adenopathy (right) present.  Skin:    General: Skin is warm and dry.  Neurological:     Mental Status: She is alert.      ED Treatments / Results  Labs (all labs ordered are listed, but only abnormal results are displayed) Labs Reviewed - No data to display  EKG None  Radiology No results found.  Procedures Procedures (including critical care time)  Medications Ordered in ED Medications - No data to display   Initial Impression / Assessment and Plan / ED Course  I have reviewed the triage vital signs and the nursing notes.  Pertinent labs & imaging results that were available during my care of the patient were reviewed by me and considered in my medical decision making (see chart for details).     Final Clinical Impressions(s) / ED Diagnoses   Final diagnoses:  Rhinitis, unspecified type   Patient complaining of symptoms of sinusitis in setting of untreated seasonal allergies. No significant cough or lower respiratory sxs. Low risk for covid. Doubt flu. Nontoxic today and very well appearing.  Mild to moderate symptoms of clear/yellow nasal discharge/congestion  for less than 10 days.  Patient is afebrile.  No concern for acute bacterial rhinosinusitis; likely viral in nature.  Patient discharged with symptomatic treatment.  Patient instructions given for warm saline nasal washes.  Recommendations for follow-up with primary care  physician.  Advised to return if worse. Pt voices understanding and is in agreement with plan. All questions answered.   ED Discharge Orders         Ordered    fluticasone (FLONASE) 50 MCG/ACT nasal spray  Daily     01/25/19 1337    carbamide peroxide (DEBROX) 6.5 % OTIC solution  2 times daily     01/25/19 1337    loratadine (CLARITIN) 10 MG tablet  Daily     01/25/19 1337           Karrie Meres, PA-C 01/25/19 1341    Little, Ambrose Finland, MD 01/25/19 1441

## 2019-05-15 ENCOUNTER — Observation Stay (HOSPITAL_BASED_OUTPATIENT_CLINIC_OR_DEPARTMENT_OTHER)
Admission: EM | Admit: 2019-05-15 | Discharge: 2019-05-17 | Disposition: A | Discharge status: Home / Self Care | Payer: 59 | Attending: Surgery | Admitting: Surgery

## 2019-05-15 ENCOUNTER — Encounter (HOSPITAL_BASED_OUTPATIENT_CLINIC_OR_DEPARTMENT_OTHER): Payer: Self-pay | Admitting: Emergency Medicine

## 2019-05-15 ENCOUNTER — Other Ambulatory Visit: Payer: Self-pay

## 2019-05-15 ENCOUNTER — Emergency Department (HOSPITAL_BASED_OUTPATIENT_CLINIC_OR_DEPARTMENT_OTHER): Payer: 59

## 2019-05-15 DIAGNOSIS — K810 Acute cholecystitis: Secondary | ICD-10-CM | POA: Diagnosis present

## 2019-05-15 DIAGNOSIS — I10 Essential (primary) hypertension: Secondary | ICD-10-CM | POA: Diagnosis not present

## 2019-05-15 DIAGNOSIS — K8012 Calculus of gallbladder with acute and chronic cholecystitis without obstruction: Secondary | ICD-10-CM | POA: Diagnosis not present

## 2019-05-15 DIAGNOSIS — Z1159 Encounter for screening for other viral diseases: Secondary | ICD-10-CM | POA: Diagnosis not present

## 2019-05-15 DIAGNOSIS — Z6835 Body mass index (BMI) 35.0-35.9, adult: Secondary | ICD-10-CM | POA: Insufficient documentation

## 2019-05-15 DIAGNOSIS — Z885 Allergy status to narcotic agent status: Secondary | ICD-10-CM | POA: Diagnosis not present

## 2019-05-15 DIAGNOSIS — E669 Obesity, unspecified: Secondary | ICD-10-CM | POA: Diagnosis not present

## 2019-05-15 LAB — CBC WITH DIFFERENTIAL/PLATELET
Abs Immature Granulocytes: 0.18 10*3/uL — ABNORMAL HIGH (ref 0.00–0.07)
Basophils Absolute: 0.1 10*3/uL (ref 0.0–0.1)
Basophils Relative: 1 %
Eosinophils Absolute: 0.3 10*3/uL (ref 0.0–0.5)
Eosinophils Relative: 2 %
HCT: 34.9 % — ABNORMAL LOW (ref 36.0–46.0)
Hemoglobin: 10.8 g/dL — ABNORMAL LOW (ref 12.0–15.0)
Immature Granulocytes: 1 %
Lymphocytes Relative: 12 %
Lymphs Abs: 2.1 10*3/uL (ref 0.7–4.0)
MCH: 23 pg — ABNORMAL LOW (ref 26.0–34.0)
MCHC: 30.9 g/dL (ref 30.0–36.0)
MCV: 74.3 fL — ABNORMAL LOW (ref 80.0–100.0)
Monocytes Absolute: 2.6 10*3/uL — ABNORMAL HIGH (ref 0.1–1.0)
Monocytes Relative: 15 %
Neutro Abs: 12.2 10*3/uL — ABNORMAL HIGH (ref 1.7–7.7)
Neutrophils Relative %: 69 %
Platelets: 335 10*3/uL (ref 150–400)
RBC: 4.7 MIL/uL (ref 3.87–5.11)
RDW: 14 % (ref 11.5–15.5)
WBC: 17.5 10*3/uL — ABNORMAL HIGH (ref 4.0–10.5)
nRBC: 0 % (ref 0.0–0.2)

## 2019-05-15 LAB — URINALYSIS, MICROSCOPIC (REFLEX): RBC / HPF: >50 RBC/hpf (ref 0–5)

## 2019-05-15 LAB — COMPREHENSIVE METABOLIC PANEL
ALT: 126 U/L — ABNORMAL HIGH (ref 0–44)
AST: 88 U/L — ABNORMAL HIGH (ref 15–41)
Albumin: 3.1 g/dL — ABNORMAL LOW (ref 3.5–5.0)
Alkaline Phosphatase: 226 U/L — ABNORMAL HIGH (ref 38–126)
Anion gap: 12 (ref 5–15)
BUN: 7 mg/dL (ref 6–20)
CO2: 23 mmol/L (ref 22–32)
Calcium: 9.1 mg/dL (ref 8.9–10.3)
Chloride: 99 mmol/L (ref 98–111)
Creatinine, Ser: 0.69 mg/dL (ref 0.44–1.00)
GFR calc Af Amer: >60 mL/min (ref >60)
GFR calc non Af Amer: >60 mL/min (ref >60)
Glucose, Bld: 94 mg/dL (ref 70–99)
Potassium: 3.7 mmol/L (ref 3.5–5.1)
Sodium: 134 mmol/L — ABNORMAL LOW (ref 135–145)
Total Bilirubin: 1.1 mg/dL (ref 0.3–1.2)
Total Protein: 8.5 g/dL — ABNORMAL HIGH (ref 6.5–8.1)

## 2019-05-15 LAB — LIPASE, BLOOD: Lipase: 35 U/L (ref 11–51)

## 2019-05-15 LAB — URINALYSIS, ROUTINE W REFLEX MICROSCOPIC
Glucose, UA: 100 mg/dL — AB
Ketones, ur: NEGATIVE mg/dL
Leukocytes,Ua: NEGATIVE
Nitrite: NEGATIVE
Protein, ur: 100 mg/dL — AB
Specific Gravity, Urine: 1.025 (ref 1.005–1.030)
pH: 6.5 (ref 5.0–8.0)

## 2019-05-15 LAB — PREGNANCY, URINE: Preg Test, Ur: NEGATIVE

## 2019-05-15 LAB — SARS CORONAVIRUS 2 BY RT PCR (HOSPITAL ORDER, PERFORMED IN ~~LOC~~ HOSPITAL LAB): SARS Coronavirus 2: NEGATIVE

## 2019-05-15 LAB — SARS CORONAVIRUS 2 AG (30 MIN TAT): SARS Coronavirus 2 Ag: NEGATIVE

## 2019-05-15 MED ORDER — OXYCODONE HCL 5 MG PO TABS
5.0000 mg | ORAL_TABLET | ORAL | Status: DC | PRN
  Administered 2019-05-16 – 2019-05-17 (×3): 10 mg via ORAL
  Filled 2019-05-15 (×3): qty 2

## 2019-05-15 MED ORDER — ONDANSETRON 4 MG PO TBDP: 4.0000 mg | ORAL_TABLET | Freq: Four times a day (QID) | ORAL | Status: DC | PRN

## 2019-05-15 MED ORDER — PIPERACILLIN-TAZOBACTAM 3.375 G IVPB
3.3750 g | Freq: Once | INTRAVENOUS | Status: AC
  Administered 2019-05-15: 3.375 g via INTRAVENOUS
  Filled 2019-05-15: qty 50

## 2019-05-15 MED ORDER — PIPERACILLIN-TAZOBACTAM 3.375 G IVPB
3.3750 g | Freq: Three times a day (TID) | INTRAVENOUS | Status: DC
  Administered 2019-05-15 – 2019-05-17 (×5): 3.375 g via INTRAVENOUS
  Filled 2019-05-15 (×5): qty 50

## 2019-05-15 MED ORDER — SODIUM CHLORIDE 0.9 % IV BOLUS
1000.0000 mL | Freq: Once | INTRAVENOUS | Status: AC
  Administered 2019-05-15: 1000 mL via INTRAVENOUS

## 2019-05-15 MED ORDER — ONDANSETRON HCL 4 MG/2ML IJ SOLN
4.0000 mg | Freq: Four times a day (QID) | INTRAMUSCULAR | Status: DC | PRN
  Administered 2019-05-16: 4 mg via INTRAVENOUS
  Filled 2019-05-15: qty 2

## 2019-05-15 MED ORDER — KETOROLAC TROMETHAMINE 30 MG/ML IJ SOLN
30.0000 mg | Freq: Once | INTRAMUSCULAR | Status: AC
  Administered 2019-05-15: 30 mg via INTRAVENOUS
  Filled 2019-05-15: qty 1

## 2019-05-15 MED ORDER — MORPHINE SULFATE (PF) 2 MG/ML IV SOLN
1.0000 mg | INTRAVENOUS | Status: DC | PRN
  Administered 2019-05-16: 2 mg via INTRAVENOUS
  Filled 2019-05-15: qty 1

## 2019-05-15 MED ORDER — ONDANSETRON HCL 4 MG/2ML IJ SOLN
4.0000 mg | Freq: Once | INTRAMUSCULAR | Status: AC
  Administered 2019-05-15: 4 mg via INTRAVENOUS
  Filled 2019-05-15: qty 2

## 2019-05-15 MED ORDER — KCL IN DEXTROSE-NACL 20-5-0.45 MEQ/L-%-% IV SOLN
INTRAVENOUS | Status: DC
  Administered 2019-05-15: via INTRAVENOUS
  Filled 2019-05-15: qty 1000

## 2019-05-15 MED ORDER — IOHEXOL 300 MG/ML  SOLN
100.0000 mL | Freq: Once | INTRAMUSCULAR | Status: AC | PRN
  Administered 2019-05-15: 100 mL via INTRAVENOUS

## 2019-05-15 MED ORDER — ACETAMINOPHEN 325 MG PO TABS
650.0000 mg | ORAL_TABLET | Freq: Once | ORAL | Status: AC | PRN
  Administered 2019-05-15: 650 mg via ORAL
  Filled 2019-05-15: qty 2

## 2019-05-15 MED ORDER — MUPIROCIN 2 % EX OINT
1.0000 "application " | TOPICAL_OINTMENT | Freq: Two times a day (BID) | CUTANEOUS | Status: DC
  Administered 2019-05-16: 1 via NASAL
  Filled 2019-05-15: qty 22

## 2019-05-15 NOTE — ED Notes (Signed)
Patient transported to CT 

## 2019-05-15 NOTE — ED Notes (Signed)
Gen surgery consult called at 17:25 via [facility]. Spoke with Joelene Millin

## 2019-05-15 NOTE — ED Triage Notes (Signed)
Pt c/o bilateral lower back pain radiating into abd. She has had N/V/D for several days earlier this week but that stopped on Thursday. Denies dysuria.

## 2019-05-15 NOTE — Progress Notes (Signed)
Received report @ 1954. Awaiting pt arrival to the floor.

## 2019-05-15 NOTE — ED Notes (Signed)
ED TO INPATIENT HANDOFF REPORT  ED Nurse Name and Phone #: Chauncey MannSusanna  S Name/Age/Gender Sonia CanaryAndrea C Carcione 33 y.o. female Room/Bed: MH06/MH06  Code Status   Code Status: Not on file  Home/SNF/Other Home Patient oriented to: self, place, time and situation Is this baseline? Yes   Triage Complete: Triage complete  Chief Complaint Flank Pain  Triage Note Pt c/o bilateral lower back pain radiating into abd. She has had N/V/D for several days earlier this week but that stopped on Thursday. Denies dysuria.    Allergies Allergies  Allergen Reactions  . Vicodin [Hydrocodone-Acetaminophen] Itching    Level of Care/Admitting Diagnosis ED Disposition    None      B Medical/Surgery History Past Medical History:  Diagnosis Date  . Agenesis of uterus   . Hypertension   . Migraine   . Pseudoseizures    "elated to being upset"   Past Surgical History:  Procedure Laterality Date  . NO PAST SURGERIES       A IV Location/Drains/Wounds Patient Lines/Drains/Airways Status   Active Line/Drains/Airways    Name:   Placement date:   Placement time:   Site:   Days:   Peripheral IV 05/15/19 Left Antecubital   05/15/19    1603    Antecubital   less than 1          Intake/Output Last 24 hours  Intake/Output Summary (Last 24 hours) at 05/15/2019 1731 Last data filed at 05/15/2019 1723 Gross per 24 hour  Intake 1000 ml  Output -  Net 1000 ml    Labs/Imaging Results for orders placed or performed during the hospital encounter of 05/15/19 (from the past 48 hour(s))  Urinalysis, Routine w reflex microscopic     Status: Abnormal   Collection Time: 05/15/19  3:40 PM  Result Value Ref Range   Color, Urine BROWN (A) YELLOW    Comment: BIOCHEMICALS MAY BE AFFECTED BY COLOR   APPearance CLOUDY (A) CLEAR   Specific Gravity, Urine 1.025 1.005 - 1.030   pH 6.5 5.0 - 8.0   Glucose, UA 100 (A) NEGATIVE mg/dL   Hgb urine dipstick LARGE (A) NEGATIVE   Bilirubin Urine MODERATE (A)  NEGATIVE   Ketones, ur NEGATIVE NEGATIVE mg/dL   Protein, ur 161100 (A) NEGATIVE mg/dL   Nitrite NEGATIVE NEGATIVE   Leukocytes,Ua NEGATIVE NEGATIVE    Comment: Performed at [facility] [place], [address] [place], [place] [address]  Pregnancy, urine     Status: None   Collection Time: 05/15/19  3:40 PM  Result Value Ref Range   Preg Test, Ur NEGATIVE NEGATIVE    Comment:        THE SENSITIVITY OF THIS METHODOLOGY IS >20 mIU/mL. Performed at [facility] [place], [address] [place], [place] [address]   Urinalysis, Microscopic (reflex)     Status: Abnormal   Collection Time: 05/15/19  3:40 PM  Result Value Ref Range   RBC / HPF >50 0 - 5 RBC/hpf   WBC, UA 6-10 0 - 5 WBC/hpf   Bacteria, UA MANY (A) NONE SEEN   Squamous Epithelial / LPF 11-20 0 - 5   Mucus PRESENT     Comment: Performed at [facility] [place], [address] [place], [place] [address]  Comprehensive metabolic panel     Status: Abnormal   Collection Time: 05/15/19  4:02 PM  Result Value Ref Range   Sodium 134 (L) 135 - 145 mmol/L   Potassium  3.7 3.5 - 5.1 mmol/L   Chloride 99 98 - 111 mmol/L   CO2 23 22 - 32 mmol/L   Glucose, Bld 94 70 - 99 mg/dL   BUN 7 6 - 20 mg/dL   Creatinine, Ser 1.610.69 0.44 - 1.00 mg/dL   Calcium 9.1 8.9 - 09.610.3 mg/dL   Total Protein 8.5 (H) 6.5 - 8.1 g/dL   Albumin 3.1 (L) 3.5 - 5.0 g/dL   AST 88 (H) 15 - 41 U/L   ALT 126 (H) 0 - 44 U/L   Alkaline Phosphatase 226 (H) 38 - 126 U/L   Total Bilirubin 1.1 0.3 - 1.2 mg/dL   GFR calc non Af Amer >60 >60 mL/min   GFR calc Af Amer >60 >60 mL/min   Anion gap 12 5 - 15    Comment: Performed at [facility] [place], [address] [place], [place] [address]  Lipase, blood     Status: None   Collection Time: 05/15/19  4:02 PM  Result Value Ref Range   Lipase 35 11 - 51 U/L    Comment: Performed at [facility] [place], [address] [place], [place] [address]  CBC with Differential     Status: Abnormal   Collection  Time: 05/15/19  4:02 PM  Result Value Ref Range   WBC 17.5 (H) 4.0 - 10.5 K/uL   RBC 4.70 3.87 - 5.11 MIL/uL   Hemoglobin 10.8 (L) 12.0 - 15.0 g/dL   HCT 14.734.9 (L) 82.936.0 - 56.246.0 %   MCV 74.3 (L) 80.0 - 100.0 fL   MCH 23.0 (L) 26.0 - 34.0 pg   MCHC 30.9 30.0 - 36.0 g/dL   RDW 13.014.0 86.511.5 - 78.415.5 %   Platelets 335 150 - 400 K/uL   nRBC 0.0 0.0 - 0.2 %   Neutrophils Relative % 69 %   Neutro Abs 12.2 (H) 1.7 - 7.7 K/uL   Lymphocytes Relative 12 %   Lymphs Abs 2.1 0.7 - 4.0 K/uL   Monocytes Relative 15 %   Monocytes Absolute 2.6 (H) 0.1 - 1.0 K/uL   Eosinophils Relative 2 %   Eosinophils Absolute 0.3 0.0 - 0.5 K/uL   Basophils Relative 1 %   Basophils Absolute 0.1 0.0 - 0.1 K/uL   Immature Granulocytes 1 %   Abs Immature Granulocytes 0.18 (H) 0.00 - 0.07 K/uL    Comment: Performed at [facility] [place], [address] [place], [place] [address]   Ct Abdomen Pelvis W Contrast  Result Date: 05/15/2019 CLINICAL DATA:  Low back pain radiating to the abdomen. Nausea vomiting and diarrhea. EXAM: CT ABDOMEN AND PELVIS WITH CONTRAST TECHNIQUE: Multidetector CT imaging of the abdomen and pelvis was performed using the standard protocol following bolus administration of intravenous contrast. CONTRAST:  100mL OMNIPAQUE IOHEXOL 300 MG/ML  SOLN COMPARISON:  August 08, 2012. FINDINGS: Lower chest: No acute abnormality. Hepatobiliary: Normal appearance of the liver. Low-density foci within the gallbladder likely represent cholesterol gallstones. The gallbladder wall is markedly thickened to 11 mm with edematous appearance and pericholecystic fluid. Question dehiscence of the wall. Small amount of fluid/fat stranding extends inferiorly to the right pericolic mesenteric. Pancreas: Unremarkable. No pancreatic ductal dilatation or surrounding inflammatory changes. Spleen: Normal in size without focal abnormality. Adrenals/Urinary Tract: Adrenal glands are unremarkable. Kidneys are normal, without renal calculi,  focal lesion, or hydronephrosis. Bladder is unremarkable. Stomach/Bowel: Stomach is within normal limits. No evidence of appendicitis. No evidence of bowel wall thickening, distention,  or inflammatory changes. Vascular/Lymphatic: No significant vascular findings are present. No enlarged abdominal or pelvic lymph nodes. Reproductive: Status post hysterectomy. Prominence of the right ovary, which measures approximately 4.3 cm. Other: No abdominal wall hernia or abnormality. No abdominopelvic ascites. Musculoskeletal: No acute or significant osseous findings. IMPRESSION: Acute cholecystitis with markedly edematous appearance of the gallbladder wall and possible disruption of the wall. Moderate amount of associated pericholecystic fluid and fat stranding extending caudally. These results were called by telephone at the time of interpretation on 05/15/2019 at 5:08 pm to Dr. Veryl Speak , who verbally acknowledged these results. Electronically Signed   By: Fidela Salisbury M.D.   On: 05/15/2019 17:18    Pending Labs Unresulted Labs (From admission, onward)    Start     Ordered   05/15/19 1713  SARS Coronavirus 2 (Hosp order,Performed in [facility] lab via [employer] [facility])  (Asymptomatic Patients Labs)  Once,   STAT    Question:  Rule Out  Answer:  Yes   05/15/19 1713          Vitals/Pain Today's Vitals   05/15/19 1522 05/15/19 1524 05/15/19 1601  BP:  (!) 109/91   Pulse:  (!) 108   Resp:  20   Temp:  (!) 100.6 F (38.1 C)   TempSrc:  Oral   SpO2:  100%   Weight: 108.9 kg    Height: 5\' 9"  (1.753 m)    PainSc: 10-Worst pain ever  10-Worst pain ever    Isolation Precautions No active isolations  Medications Medications  piperacillin-tazobactam (ZOSYN) IVPB 3.375 g (3.375 g Intravenous New Bag/Given 05/15/19 1720)  acetaminophen (TYLENOL) tablet 650 mg (650 mg Oral Given 05/15/19 1547)  sodium chloride 0.9 % bolus 1,000 mL (0 mLs Intravenous Stopped 05/15/19 1723)  ondansetron (ZOFRAN)  injection 4 mg (4 mg Intravenous Given 05/15/19 1604)  ketorolac (TORADOL) 30 MG/ML injection 30 mg (30 mg Intravenous Given 05/15/19 1604)  iohexol (OMNIPAQUE) 300 MG/ML solution 100 mL (100 mLs Intravenous Contrast Given 05/15/19 1644)    Mobility walks Low fall risk   Focused Assessments pain   R Recommendations: See Admitting Provider Note  Report given to:   Additional Notes:

## 2019-05-15 NOTE — ED Provider Notes (Signed)
[facility] EMERGENCY DEPARTMENT Provider Note   CSN: [id number] Arrival date & time: 05/15/19  1509     History   Chief Complaint Chief Complaint  Patient presents with   Back Pain    HPI Sonia Morris is a 33 y.o. female.     Patient is a 33 year old female with past medical history of hypertension, migraines.  She presents today for evaluation of abdominal discomfort, nausea, vomiting, and diarrhea that she has had for the past 6 days.  She describes generalized abdominal cramping with emesis and diarrhea that are nonbloody.  She has low-grade fever upon presentation, but denies fever at home.  She denies any ill contacts.  She denies any dysuria.  The history is provided by the patient.  Abdominal Pain Pain location:  Generalized Pain quality: cramping   Pain radiates to:  R flank and L flank Pain severity:  Moderate Onset quality:  Gradual Duration:  1 week Timing:  Constant Progression:  Worsening Chronicity:  New Relieved by:  Nothing Worsened by:  Nothing   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"    Patient Active Problem List   Diagnosis Date Noted   Vaginal bleeding 07/16/2013   BV (bacterial vaginosis) 07/16/2013   General medical examination 05/17/2011   Agenesis of uterus 04/12/2011   Cystocele 04/12/2011   Migraines 04/12/2011    Past Surgical History:  Procedure Laterality Date   NO PAST SURGERIES       OB History   No obstetric history on file.      Home Medications    Prior to Admission medications   Medication Sig Start Date End Date Taking? Authorizing Provider  ciprofloxacin (CILOXAN) 0.3 % ophthalmic solution Place 1 drop into the right eye every 4 (four) hours while awake. Administer 1 drop, every 4 hours, while awake, for 2 days. Then 1 drop 4 times per day for the next 5 days. 12/23/18   Langston Masker B, PA-C    fluticasone (FLONASE) 50 MCG/ACT nasal spray Place 2 sprays into both nostrils daily. 01/25/19   Couture, Cortni S, PA-C  loratadine (CLARITIN) 10 MG tablet Take 1 tablet (10 mg total) by mouth daily for 30 days. 01/25/19 02/24/19  Couture, Cortni S, PA-C    Family History Family History  Problem Relation Age of Onset   Hypertension Mother     Social History Social History   Tobacco Use   Smoking status: Never Smoker   Smokeless tobacco: Never Used  Substance Use Topics   Alcohol use: Yes    Comment: occ   Drug use: No     Allergies   Vicodin [hydrocodone-acetaminophen]   Review of Systems Review of Systems  Gastrointestinal: Positive for abdominal pain.  All other systems reviewed and are negative.    Physical Exam Updated Vital Signs BP (!) 109/91 (BP Location: Left Arm)    Pulse (!) 108    Temp (!) 100.6 F (38.1 C) (Oral)    Resp 20    Ht 5' 9" (1.753 m)    Wt 108.9 kg    SpO2 100%    BMI 35.44 kg/m   Physical Exam Vitals signs and nursing note reviewed.  Constitutional:      General: She is not in acute distress.    Appearance: She is well-developed. She is not diaphoretic.  HENT:  Head: Normocephalic and atraumatic.     Mouth/Throat:     Mouth: Mucous membranes are moist.  Neck:     Musculoskeletal: Normal range of motion and neck supple.  Cardiovascular:     Rate and Rhythm: Normal rate and regular rhythm.     Heart sounds: No murmur. No friction rub. No gallop.   Pulmonary:     Effort: Pulmonary effort is normal. No respiratory distress.     Breath sounds: Normal breath sounds. No wheezing.  Abdominal:     General: Bowel sounds are normal. There is no distension.     Palpations: Abdomen is soft.     Tenderness: There is abdominal tenderness. There is no guarding or rebound.     Comments: There is generalized abdominal tenderness.  Musculoskeletal: Normal range of motion.  Skin:    General: Skin is warm and dry.     Coloration: Skin is  not jaundiced or pale.  Neurological:     Mental Status: She is alert and oriented to person, place, and time.      ED Treatments / Results  Labs (all labs ordered are listed, but only abnormal results are displayed) Labs Reviewed  PREGNANCY, URINE  URINALYSIS, ROUTINE W REFLEX MICROSCOPIC  COMPREHENSIVE METABOLIC PANEL  LIPASE, BLOOD  CBC WITH DIFFERENTIAL/PLATELET    EKG None  Radiology No results found.  Procedures Procedures (including critical care time)  Medications Ordered in ED Medications  sodium chloride 0.9 % bolus 1,000 mL (has no administration in time range)  ondansetron (ZOFRAN) injection 4 mg (has no administration in time range)  ketorolac (TORADOL) 30 MG/ML injection 30 mg (has no administration in time range)  acetaminophen (TYLENOL) tablet 650 mg (650 mg Oral Given 05/15/19 1547)     Initial Impression / Assessment and Plan / ED Course  I have reviewed the triage vital signs and the nursing notes.  Pertinent labs & imaging results that were available during my care of the patient were reviewed by me and considered in my medical decision making (see chart for details).  Patient presenting here with abdominal pain, nausea, vomiting, and loose stools.  Patient has tenderness throughout the abdomen.  Her work-up shows a white count of 18,000 with mild elevations of the transaminases and alk phos.  CT scan shows thickened gallbladder with possible disruption of the gallbladder wall consistent with acute cholecystitis.  Care was discussed with Dr. Lucia Gaskins from general surgery.  Patient [facility] for surgical intervention.  Intravenous Zosyn administered here.  Final Clinical Impressions(s) / ED Diagnoses   Final diagnoses:  None    ED Discharge Orders    None       Veryl Speak, MD 05/15/19 [phone]

## 2019-05-15 NOTE — H&P (Signed)
Re:   Sonia Morris DOB:   15-Apr-1986 MRN:   [id number]  Chief Complaint Abdominal pain  ASSESEMENT AND PLAN: 1.  Acute cholecystitis, cholelithiasis  I discussed with the patient the indications and risks of gall bladder surgery.  The primary risks of gall bladder surgery include, but are not limited to, bleeding, infection, common bile duct injury, and open surgery.  There is also the risk that the patient may have continued symptoms after surgery.  We discussed the typical post-operative recovery course. I tried to answer the patient's questions.  Because of her acute infection, she is at higher risk for open gall bladder surgery.  I got her mother and father, Sheralyn Boatmanoni and Maxie BetterCharles Elbert, on the phone and reviewed the plan with them   2.  HTN - untreated 3.  Migraines 4.  Obese  Chief Complaint  Patient presents with  . Back Pain   PHYSICIAN REQUESTING CONSULTATION:  Geoffery Lyonsouglas Delo, MD, [facility]  HISTORY OF PRESENT ILLNESS: Sonia Canaryndrea C Faust is a 33 y.o. (DOB: 15-Apr-1986)  AA female whose primary care physician is Patient, No Pcp Per.   [facility]  She has had diarrhea and vomiting since last Sunday, 7/5.  She has been out of work because of an injury to her left knee, so it sounds like she just sat at home.  Last night, she hurt more and got sicker and her mother [facility].  She has not been jaundiced.  She has had no prior episode like this.  She has no stomach, liver, colon disease.   She's had no prior surgery.  Her grandmother had gall bladder disease.  CT scan of abdomen - 05/15/2019 - Acute cholecystitis with markedly edematous appearance of the gallbladder wall and possible disruption of the wall. Moderate amount of associated pericholecystic fluid and fat stranding extending caudally. WBC - 17,500 - 05/15/2019    Past Medical History:  Diagnosis Date  . Agenesis of uterus   . Hypertension   . Migraine    . Pseudoseizures    "elated to being upset"      Past Surgical History:  Procedure Laterality Date  . NO PAST SURGERIES        Current Facility-Administered Medications  Medication Dose Route Frequency Provider Last Rate Last Dose  . piperacillin-tazobactam (ZOSYN) IVPB 3.375 g  3.375 g Intravenous Once Geoffery Lyonselo, Douglas, MD 12.5 mL/hr at 05/15/19 1720 3.375 g at 05/15/19 1720   Current Outpatient Medications  Medication Sig Dispense Refill  . ciprofloxacin (CILOXAN) 0.3 % ophthalmic solution Place 1 drop into the right eye every 4 (four) hours while awake. Administer 1 drop, every 4 hours, while awake, for 2 days. Then 1 drop 4 times per day for the next 5 days. 5 mL 0  . fluticasone (FLONASE) 50 MCG/ACT nasal spray Place 2 sprays into both nostrils daily. 16 g 0  . loratadine (CLARITIN) 10 MG tablet Take 1 tablet (10 mg total) by mouth daily for 30 days. 30 tablet 0      Allergies  Allergen Reactions  . Vicodin [Hydrocodone-Acetaminophen] Itching    REVIEW OF SYSTEMS: Skin:  No history of rash.  No history of abnormal moles. Infection:  No history of hepatitis or HIV.  No history of MRSA. Neurologic:  History of migraines Cardiac:  HTN.  On no meds.  Does not have a PCP  No history of heart disease.  No  history of seeing a cardiologist. Pulmonary:  Does not smoke cigarettes.  No asthma or bronchitis.  No OSA/CPAP.  Endocrine:  No diabetes. No thyroid disease. Gastrointestinal:  See HPI Urologic:  No history of kidney stones.  No history of bladder infections. Musculoskeletal:  Left knee injury - does not remember who she saw.  There are no films in Epic. Hematologic:  No bleeding disorder.  No history of anemia.  Not anticoagulated. Psycho-social:  The patient is oriented.   The patient has no obvious psychologic or social impairment to understanding our conversation and plan.  SOCIAL and FAMILY HISTORY: Unmarried. Lives with parents, Vivien Rota and Devan Babino. She works as a  Marine scientist at [employer].  Her grandmother had a history of gall bladder disease.Marland Kitchen  PHYSICAL EXAM: BP 133/85 (BP Location: Right Arm)   Pulse 96   Temp 98.5 F (36.9 C) (Oral)   Resp 18   Ht 5\' 9"  (1.753 m)   Wt 108.9 kg   SpO2 100%   BMI 35.44 kg/m   General: Obese AA F who is alert and generally healthy appearing.  Skin:  Inspection and palpation - no mass or rash. Eyes:  Conjunctiva and lids unremarkable.            Pupils are equal Ears, Nose, Mouth, and Throat:  Ears and nose unremarkable            Lips and teeth are unremarable. Neck: Supple. No mass, trachea midline.  No thyroid mass. Lymph Nodes:  No supraclavicular, cervical, or inguinal nodes. Lungs: Normal respiratory effort.  Clear to auscultation and symmetric breath sounds. Heart:  Palpation of the heart is normal.            Auscultation: RRR. No murmur or rub.  Abdomen: Soft. No mass. No hernia.              Decreased bowel sounds.  No abdominal scars.  She is tender in her right upper quadrant. Rectal: Not done. Musculoskeletal:  Good muscle strength and ROM  in upper and lower extremities.  Neurologic:  Grossly intact to motor and sensory function. Psychiatric: Normal judgement and insight. Behavior is normal.            Oriented to time, person, place.   DATA REVIEWED, COUNSELING AND COORDINATION OF CARE: Epic notes reviewed. Counseling and coordination of care exceeded more than 50% of the time spent with patient. Total time spent with patient and charting: 45 minutes  Alphonsa Overall, MD,  [facility], [address]  [place], [place]    [place] Phone:  [phone] FAX:  [phone]

## 2019-05-16 ENCOUNTER — Observation Stay (HOSPITAL_COMMUNITY): Payer: 59

## 2019-05-16 ENCOUNTER — Observation Stay (HOSPITAL_COMMUNITY): Payer: 59 | Admitting: Anesthesiology

## 2019-05-16 ENCOUNTER — Encounter (HOSPITAL_COMMUNITY): Admission: EM | Disposition: A | Discharge status: Home / Self Care | Payer: Self-pay | Source: Home / Self Care

## 2019-05-16 HISTORY — PX: CHOLECYSTECTOMY: SHX55

## 2019-05-16 LAB — CBC WITH DIFFERENTIAL/PLATELET
Abs Immature Granulocytes: 0.14 10*3/uL — ABNORMAL HIGH (ref 0.00–0.07)
Basophils Absolute: 0.1 10*3/uL (ref 0.0–0.1)
Basophils Relative: 1 %
Eosinophils Absolute: 0.3 10*3/uL (ref 0.0–0.5)
Eosinophils Relative: 2 %
HCT: 32.5 % — ABNORMAL LOW (ref 36.0–46.0)
Hemoglobin: 9.6 g/dL — ABNORMAL LOW (ref 12.0–15.0)
Immature Granulocytes: 1 %
Lymphocytes Relative: 15 %
Lymphs Abs: 1.8 10*3/uL (ref 0.7–4.0)
MCH: 22.3 pg — ABNORMAL LOW (ref 26.0–34.0)
MCHC: 29.5 g/dL — ABNORMAL LOW (ref 30.0–36.0)
MCV: 75.6 fL — ABNORMAL LOW (ref 80.0–100.0)
Monocytes Absolute: 1.5 10*3/uL — ABNORMAL HIGH (ref 0.1–1.0)
Monocytes Relative: 13 %
Neutro Abs: 8.2 10*3/uL — ABNORMAL HIGH (ref 1.7–7.7)
Neutrophils Relative %: 68 %
Platelets: 299 10*3/uL (ref 150–400)
RBC: 4.3 MIL/uL (ref 3.87–5.11)
RDW: 13.9 % (ref 11.5–15.5)
WBC: 12 10*3/uL — ABNORMAL HIGH (ref 4.0–10.5)
nRBC: 0 % (ref 0.0–0.2)

## 2019-05-16 LAB — COMPREHENSIVE METABOLIC PANEL
ALT: 107 U/L — ABNORMAL HIGH (ref 0–44)
AST: 60 U/L — ABNORMAL HIGH (ref 15–41)
Albumin: 2.6 g/dL — ABNORMAL LOW (ref 3.5–5.0)
Alkaline Phosphatase: 203 U/L — ABNORMAL HIGH (ref 38–126)
Anion gap: 10 (ref 5–15)
BUN: 8 mg/dL (ref 6–20)
CO2: 22 mmol/L (ref 22–32)
Calcium: 8.3 mg/dL — ABNORMAL LOW (ref 8.9–10.3)
Chloride: 103 mmol/L (ref 98–111)
Creatinine, Ser: 0.72 mg/dL (ref 0.44–1.00)
GFR calc Af Amer: >60 mL/min (ref >60)
GFR calc non Af Amer: >60 mL/min (ref >60)
Glucose, Bld: 86 mg/dL (ref 70–99)
Potassium: 3.7 mmol/L (ref 3.5–5.1)
Sodium: 135 mmol/L (ref 135–145)
Total Bilirubin: 0.8 mg/dL (ref 0.3–1.2)
Total Protein: 7.1 g/dL (ref 6.5–8.1)

## 2019-05-16 LAB — SURGICAL PCR SCREEN
MRSA, PCR: NEGATIVE
Staphylococcus aureus: NEGATIVE

## 2019-05-16 SURGERY — LAPAROSCOPIC CHOLECYSTECTOMY WITH INTRAOPERATIVE CHOLANGIOGRAM
Anesthesia: General

## 2019-05-16 MED ORDER — FENTANYL CITRATE (PF) 100 MCG/2ML IJ SOLN
INTRAMUSCULAR | Status: DC | PRN
  Administered 2019-05-16 (×6): 50 ug via INTRAVENOUS
  Administered 2019-05-16: 100 ug via INTRAVENOUS
  Administered 2019-05-16: 50 ug via INTRAVENOUS

## 2019-05-16 MED ORDER — SUGAMMADEX SODIUM 200 MG/2ML IV SOLN
INTRAVENOUS | Status: DC | PRN
  Administered 2019-05-16: 200 mg via INTRAVENOUS

## 2019-05-16 MED ORDER — SODIUM CHLORIDE 0.9 % IR SOLN
Status: DC | PRN
  Administered 2019-05-16: 1000 mL

## 2019-05-16 MED ORDER — KCL IN DEXTROSE-NACL 20-5-0.45 MEQ/L-%-% IV SOLN
INTRAVENOUS | Status: DC
  Filled 2019-05-16: qty 1000

## 2019-05-16 MED ORDER — LACTATED RINGERS IV SOLN
INTRAVENOUS | Status: DC | PRN
  Administered 2019-05-16 (×2): via INTRAVENOUS

## 2019-05-16 MED ORDER — PROPOFOL 10 MG/ML IV BOLUS
INTRAVENOUS | Status: DC | PRN
  Administered 2019-05-16: 170 mg via INTRAVENOUS

## 2019-05-16 MED ORDER — PROPOFOL 10 MG/ML IV BOLUS
INTRAVENOUS | Status: AC
  Filled 2019-05-16: qty 20

## 2019-05-16 MED ORDER — TRAMADOL HCL 50 MG PO TABS: 100.0000 mg | ORAL_TABLET | Freq: Two times a day (BID) | ORAL | Status: DC | PRN

## 2019-05-16 MED ORDER — FENTANYL CITRATE (PF) 100 MCG/2ML IJ SOLN
INTRAMUSCULAR | Status: AC
  Filled 2019-05-16: qty 2

## 2019-05-16 MED ORDER — FENTANYL CITRATE (PF) 100 MCG/2ML IJ SOLN
INTRAMUSCULAR | Status: AC
  Filled 2019-05-16: qty 4

## 2019-05-16 MED ORDER — MIDAZOLAM HCL 2 MG/2ML IJ SOLN
INTRAMUSCULAR | Status: AC
  Filled 2019-05-16: qty 2

## 2019-05-16 MED ORDER — LACTATED RINGERS IV SOLN: INTRAVENOUS | Status: DC

## 2019-05-16 MED ORDER — SUCCINYLCHOLINE CHLORIDE 200 MG/10ML IV SOSY
PREFILLED_SYRINGE | INTRAVENOUS | Status: DC | PRN
  Administered 2019-05-16: 140 mg via INTRAVENOUS

## 2019-05-16 MED ORDER — FENTANYL CITRATE (PF) 250 MCG/5ML IJ SOLN
INTRAMUSCULAR | Status: AC
  Filled 2019-05-16: qty 5

## 2019-05-16 MED ORDER — BUPIVACAINE-EPINEPHRINE (PF) 0.25% -1:200000 IJ SOLN
INTRAMUSCULAR | Status: AC
  Filled 2019-05-16: qty 30

## 2019-05-16 MED ORDER — MIDAZOLAM HCL 5 MG/5ML IJ SOLN
INTRAMUSCULAR | Status: DC | PRN
  Administered 2019-05-16: 2 mg via INTRAVENOUS

## 2019-05-16 MED ORDER — ONDANSETRON HCL 4 MG/2ML IJ SOLN
INTRAMUSCULAR | Status: DC | PRN
  Administered 2019-05-16: 4 mg via INTRAVENOUS

## 2019-05-16 MED ORDER — FENTANYL CITRATE (PF) 100 MCG/2ML IJ SOLN
25.0000 ug | INTRAMUSCULAR | Status: DC | PRN
  Administered 2019-05-16 (×3): 50 ug via INTRAVENOUS

## 2019-05-16 MED ORDER — LIDOCAINE 2% (20 MG/ML) 5 ML SYRINGE
INTRAMUSCULAR | Status: DC | PRN
  Administered 2019-05-16: 50 mg via INTRAVENOUS

## 2019-05-16 MED ORDER — BUPIVACAINE-EPINEPHRINE (PF) 0.25% -1:200000 IJ SOLN
INTRAMUSCULAR | Status: DC | PRN
  Administered 2019-05-16: 30 mL

## 2019-05-16 MED ORDER — ROCURONIUM BROMIDE 10 MG/ML (PF) SYRINGE
PREFILLED_SYRINGE | INTRAVENOUS | Status: DC | PRN
  Administered 2019-05-16: 50 mg via INTRAVENOUS

## 2019-05-16 MED ORDER — DEXAMETHASONE SODIUM PHOSPHATE 10 MG/ML IJ SOLN
INTRAMUSCULAR | Status: DC | PRN
  Administered 2019-05-16: 8 mg via INTRAVENOUS

## 2019-05-16 MED ORDER — SODIUM CHLORIDE (PF) 0.9 % IJ SOLN
INTRAMUSCULAR | Status: DC | PRN
  Administered 2019-05-16: 5 mL

## 2019-05-16 MED ORDER — LACTATED RINGERS IV SOLN
INTRAVENOUS | Status: AC | PRN
  Administered 2019-05-16: 3000 mL
  Administered 2019-05-16: 1000 mL via INTRAVENOUS

## 2019-05-16 SURGICAL SUPPLY — 39 items
APPLIER CLIP 5 13 M/L LIGAMAX5 (MISCELLANEOUS)
APPLIER CLIP ROT 10 11.4 M/L (STAPLE)
CABLE HIGH FREQUENCY MONO STRZ (ELECTRODE) ×3 IMPLANT
CHLORAPREP W/TINT 26 (MISCELLANEOUS) ×3 IMPLANT
CHOLANGIOGRAM CATH TAUT (CATHETERS) ×3 IMPLANT
CLIP APPLIE 5 13 M/L LIGAMAX5 (MISCELLANEOUS) IMPLANT
CLIP APPLIE ROT 10 11.4 M/L (STAPLE) IMPLANT
CLOSURE WOUND 1/4X4 (GAUZE/BANDAGES/DRESSINGS)
COVER MAYO STAND STRL (DRAPES) ×3 IMPLANT
COVER SURGICAL LIGHT HANDLE (MISCELLANEOUS) ×3 IMPLANT
COVER WAND RF STERILE (DRAPES) IMPLANT
DECANTER SPIKE VIAL GLASS SM (MISCELLANEOUS) ×3 IMPLANT
DERMABOND ADVANCED (GAUZE/BANDAGES/DRESSINGS) ×2
DERMABOND ADVANCED .7 DNX12 (GAUZE/BANDAGES/DRESSINGS) ×1 IMPLANT
DRAPE C-ARM 42X120 X-RAY (DRAPES) ×3 IMPLANT
ELECT REM PT RETURN 15FT ADLT (MISCELLANEOUS) ×3 IMPLANT
GLOVE SURG SIGNA 7.5 PF LTX (GLOVE) ×3 IMPLANT
GOWN STRL REUS W/TWL XL LVL3 (GOWN DISPOSABLE) ×9 IMPLANT
HEMOSTAT SURGICEL 4X8 (HEMOSTASIS) IMPLANT
IV CATH 14GX2 1/4 (CATHETERS) ×3 IMPLANT
IV SET EXTENSION CATH 6 NF (IV SETS) ×3 IMPLANT
KIT BASIN OR (CUSTOM PROCEDURE TRAY) ×3 IMPLANT
KIT TURNOVER KIT A (KITS) IMPLANT
POUCH RETRIEVAL ECOSAC 10 (ENDOMECHANICALS) ×1 IMPLANT
POUCH RETRIEVAL ECOSAC 10MM (ENDOMECHANICALS) ×2
SCISSORS LAP 5X35 DISP (ENDOMECHANICALS) ×3 IMPLANT
SET IRRIG TUBING LAPAROSCOPIC (IRRIGATION / IRRIGATOR) ×3 IMPLANT
SET TUBE SMOKE EVAC HIGH FLOW (TUBING) ×3 IMPLANT
SLEEVE ADV FIXATION 5X100MM (TROCAR) ×3 IMPLANT
STOPCOCK 4 WAY LG BORE MALE ST (IV SETS) ×3 IMPLANT
STRIP CLOSURE SKIN 1/4X4 (GAUZE/BANDAGES/DRESSINGS) IMPLANT
SUT MNCRL AB 4-0 PS2 18 (SUTURE) ×6 IMPLANT
SYR 10ML ECCENTRIC (SYRINGE) ×3 IMPLANT
TOWEL OR 17X26 10 PK STRL BLUE (TOWEL DISPOSABLE) ×3 IMPLANT
TOWEL OR NON WOVEN STRL DISP B (DISPOSABLE) ×3 IMPLANT
TRAY LAPAROSCOPIC (CUSTOM PROCEDURE TRAY) ×3 IMPLANT
TROCAR ADV FIXATION 11X100MM (TROCAR) IMPLANT
TROCAR ADV FIXATION 5X100MM (TROCAR) ×6 IMPLANT
TROCAR XCEL BLUNT TIP 100MML (ENDOMECHANICALS) ×3 IMPLANT

## 2019-05-16 NOTE — Transfer of Care (Signed)
Immediate Anesthesia Transfer of Care Note  Patient: Sonia Morris  Procedure(s) Performed: Procedure(s): LAPAROSCOPIC CHOLECYSTECTOMY WITH INTRAOPERATIVE CHOLANGIOGRAM (N/A)  Patient Location: PACU  Anesthesia Type:General  Level of Consciousness:  sedated, patient cooperative and responds to stimulation  Airway & Oxygen Therapy:Patient Spontanous Breathing and Patient connected to face mask oxgen  Post-op Assessment:  Report given to PACU RN and Post -op Vital signs reviewed and stable  Post vital signs:  Reviewed and stable  Last Vitals:  Vitals:   05/16/19 0524 05/16/19 1347  BP: 116/63 140/87  Pulse: 94   Resp: 18   Temp: 37.8 C   SpO2: 03%     Complications: No apparent anesthesia complications

## 2019-05-16 NOTE — Anesthesia Preprocedure Evaluation (Addendum)
Anesthesia Evaluation  Patient identified by MRN, date of birth, ID band Patient awake    Reviewed: Allergy & Precautions, NPO status , Patient's Chart, lab work & pertinent test results  Airway Mallampati: II  TM Distance: >3 FB     Dental   Pulmonary    breath sounds clear to auscultation       Cardiovascular hypertension,  Rhythm:Regular Rate:Normal     Neuro/Psych  Headaches, PSYCHIATRIC DISORDERS    GI/Hepatic negative GI ROS, Neg liver ROS, History noted. CG   Endo/Other  negative endocrine ROS  Renal/GU negative Renal ROS     Musculoskeletal   Abdominal   Peds  Hematology   Anesthesia Other Findings   Reproductive/Obstetrics                           Anesthesia Physical Anesthesia Plan  ASA: III  Anesthesia Plan: General   Post-op Pain Management:    Induction: Intravenous  PONV Risk Score and Plan: 3 and Ondansetron, Dexamethasone and Midazolam  Airway Management Planned: Oral ETT  Additional Equipment:   Intra-op Plan:   Post-operative Plan: Extubation in OR  Informed Consent: I have reviewed the patients History and Physical, chart, labs and discussed the procedure including the risks, benefits and alternatives for the proposed anesthesia with the patient or authorized representative who has indicated his/her understanding and acceptance.     Dental advisory given  Plan Discussed with: CRNA and Anesthesiologist  Anesthesia Plan Comments:        Anesthesia Quick Evaluation

## 2019-05-16 NOTE — Anesthesia Procedure Notes (Signed)
Procedure Name: Intubation Date/Time: 05/16/2019 11:42 AM Performed by: Anne Fu, CRNA Pre-anesthesia Checklist: Patient identified, Emergency Drugs available, Suction available, Patient being monitored and Timeout performed Patient Re-evaluated:Patient Re-evaluated prior to induction Oxygen Delivery Method: Circle system utilized Preoxygenation: Pre-oxygenation with 100% oxygen Induction Type: IV induction Ventilation: Mask ventilation without difficulty Laryngoscope Size: Mac and 4 Grade View: Grade I Tube type: Oral Tube size: 7.5 mm Number of attempts: 1 Airway Equipment and Method: Stylet Placement Confirmation: ETT inserted through vocal cords under direct vision,  positive ETCO2 and breath sounds checked- equal and bilateral Secured at: 21 cm Tube secured with: Tape Dental Injury: Teeth and Oropharynx as per pre-operative assessment

## 2019-05-16 NOTE — Op Note (Addendum)
05/16/2019  1:39 PM  PATIENT:  Sonia Morris, 33 y.o., female, MRN: [id number]  PREOP DIAGNOSIS:  Cholecystitis  POSTOP DIAGNOSIS:   Severe acute and chronic cholecystitis, cholelithiasis  PROCEDURE:   Procedure(s):  LAPAROSCOPIC CHOLECYSTECTOMY WITH INTRAOPERATIVE CHOLANGIOGRAM [5 port]  SURGEON:   Ovidio Kinavid Fanny Agan, M.D.  Threasa HeadsASSISTANNat Christen:   T. Gerkin, M.D.  ANESTHESIA:   general  Anesthesiologist: Dorris SinghGreen, Charlene, MD CRNA: Paris LoreBlanton, Shannon M, CRNA  General  ASA: 3E  EBL:  minimal  ml  BLOOD ADMINISTERED: none  DRAINS: none   LOCAL MEDICATIONS USED:   30 cc of 1/4% marcaine  SPECIMEN:   Gall bladder  COUNTS CORRECT:  YES  INDICATIONS FOR PROCEDURE:  Sonia Morris is a 33 y.o. (DOB: [birth year]) AA female whose primary care physician is Patient, No Pcp Per and comes for cholecystectomy.   [facility] night after about one week of abdominal pain.  She was found to have acute cholecystitis.   The indications and risks of the gall bladder surgery were explained to the patient.  The risks include, but are not limited to, infection, bleeding, common bile duct injury and open surgery.  SURGERY:  The patient was taken to OR [facility] at [facility].  The abdomen was prepped with chloroprep.  The patient was on Zosyn prior to the beginning of the operation.   A time out was held and the surgical checklist run.   An infraumbilical incision was made into the abdominal cavity.  A 12 mm Hasson trocar was inserted into the abdominal cavity through the infraumbilical incision and secured with a 0 Vicryl suture.  Three additional trocars were inserted: a 10 mm trocar in the sub-xiphoid location, a 5 mm trocar in the right mid subcostal area, and a 5 mm trocar in the right lateral subcostal area.  Because of difficult dissection, I added a 5th 5 mm trocar midway between the xiphoid and umbilicus.   The abdomen was explored and the liver, stomach, and bowel  that could be seen were unremarkable.   The gall bladder was severely acutely and chronically inflamed.  It was encased in omentum, which was trying to wall it off.   I grasped the gall bladder and rotated it cephalad.  Disssection was carried down to the gall bladder/cystic duct junction and the cystic duct isolated.  There was a thick rind that encased the entire gall bladder from chronic disease.  A clip was placed on the gall bladder side of the cystic duct.   An intra-operative cholangiogram was shot.   The intra-operative cholangiogram was shot using a cut off Taut catheter placed through a 14 gauge angiocath in the RUQ.  The Taut catheter was inserted in the cut cystic duct and secured with an endoclip.  A cholangiogram was shot with 8 cc of 1/2 strength Isoview.  Using fluoroscopy, the cholangiogram showed the flow of contrast into the common bile duct, up the hepatic radicals, and into the duodenum.  There was no mass or obstruction.  This was a normal intra-operative cholangiogram.   The Taut catheter was removed.  The cystic duct was tripley endoclipped and the cystic artery was identified and clipped.  The gall bladder was bluntly and sharpley dissected from the gall bladder bed.   After the gall bladder was removed from the liver, the gall bladder bed and Triangle of [place] were inspected.  There was no bleeding or bile leak.  The gall bladder was  placed in a Ecco Sac bag and delivered through the umbilicus.  The abdomen was irrigated with 3,000 cc saline.   The trocars were then removed.  I infiltrated 30cc of 1/4% Marcaine into the incisions.  The umbilical port closed with a 0 Vicryl suture and the skin closed with 4-0 Monocryl.  The skin was painted with DermaBond.  The patient's sponge and needle count were correct.  The patient was transported to the RR in good condition.   I have a surgeon as a first assist to retract, expose, and assist on this difficult operation.   Alphonsa Overall,  MD, [facility] Surgery Pager: [phone] Office phone:  [phone]

## 2019-05-16 NOTE — Plan of Care (Signed)
Patient lying in bed this morning; c/o pain in abdomen but does not require any pain medication at this time. Anticipating surgery later today. Will continue to monitor.

## 2019-05-16 NOTE — Progress Notes (Signed)
Patient received from PACU via bed. Awake, alert and oriented. Complaining of pain in mid/right abdominal area and requesting pain and nausea medicine. Patient given IV Morphine and Zofran. Up to [facility]; patient voided and returned to bed. Will continue to monitor.

## 2019-05-16 NOTE — Progress Notes (Signed)
She is ready for surgery.  Pain about the same.  Her WBC is a little better.  Alphonsa Overall, MD, [facility] Surgery Pager: [phone] Office phone:  [phone]

## 2019-05-16 NOTE — Anesthesia Postprocedure Evaluation (Signed)
Anesthesia Post Note  Patient: Sonia Morris  Procedure(s) Performed: LAPAROSCOPIC CHOLECYSTECTOMY WITH INTRAOPERATIVE CHOLANGIOGRAM (N/A )     Patient location during evaluation: PACU Anesthesia Type: General Level of consciousness: awake Pain management: pain level controlled Vital Signs Assessment: post-procedure vital signs reviewed and stable Cardiovascular status: stable Postop Assessment: no apparent nausea or vomiting Anesthetic complications: no    Last Vitals:  Vitals:   05/16/19 1511 05/16/19 1523  BP: 126/86 (!) 133/91  Pulse: (!) 101 99  Resp: (!) 24 (!) 22  Temp:  36.8 C  SpO2: 100% 100%    Last Pain:  Vitals:   05/16/19 1523  TempSrc: Oral  PainSc: 8                  Tanajah Boulter

## 2019-05-17 ENCOUNTER — Encounter (HOSPITAL_COMMUNITY): Payer: Self-pay | Admitting: Surgery

## 2019-05-17 LAB — CBC WITH DIFFERENTIAL/PLATELET
Abs Immature Granulocytes: 0.29 10*3/uL — ABNORMAL HIGH (ref 0.00–0.07)
Basophils Absolute: 0.1 10*3/uL (ref 0.0–0.1)
Basophils Relative: 0 %
Eosinophils Absolute: 0 10*3/uL (ref 0.0–0.5)
Eosinophils Relative: 0 %
HCT: 31.1 % — ABNORMAL LOW (ref 36.0–46.0)
Hemoglobin: 9.4 g/dL — ABNORMAL LOW (ref 12.0–15.0)
Immature Granulocytes: 2 %
Lymphocytes Relative: 11 %
Lymphs Abs: 1.8 10*3/uL (ref 0.7–4.0)
MCH: 23 pg — ABNORMAL LOW (ref 26.0–34.0)
MCHC: 30.2 g/dL (ref 30.0–36.0)
MCV: 76 fL — ABNORMAL LOW (ref 80.0–100.0)
Monocytes Absolute: 2.4 10*3/uL — ABNORMAL HIGH (ref 0.1–1.0)
Monocytes Relative: 14 %
Neutro Abs: 12.5 10*3/uL — ABNORMAL HIGH (ref 1.7–7.7)
Neutrophils Relative %: 73 %
Platelets: 312 10*3/uL (ref 150–400)
RBC: 4.09 MIL/uL (ref 3.87–5.11)
RDW: 14.1 % (ref 11.5–15.5)
WBC: 17 10*3/uL — ABNORMAL HIGH (ref 4.0–10.5)
nRBC: 0 % (ref 0.0–0.2)

## 2019-05-17 LAB — HIV ANTIBODY (ROUTINE TESTING W REFLEX): HIV Screen 4th Generation wRfx: NONREACTIVE

## 2019-05-17 MED ORDER — AMOXICILLIN-POT CLAVULANATE 875-125 MG PO TABS
1.0000 | ORAL_TABLET | Freq: Two times a day (BID) | ORAL | Status: DC
  Administered 2019-05-17: 1 via ORAL
  Filled 2019-05-17: qty 1

## 2019-05-17 MED ORDER — AMOXICILLIN-POT CLAVULANATE 875-125 MG PO TABS: 1.0000 | ORAL_TABLET | Freq: Two times a day (BID) | ORAL | 0 refills | Status: DC

## 2019-05-17 MED ORDER — ACETAMINOPHEN 500 MG PO TABS
1000.0000 mg | ORAL_TABLET | Freq: Three times a day (TID) | ORAL | Status: DC
  Administered 2019-05-17: 1000 mg via ORAL
  Filled 2019-05-17: qty 2

## 2019-05-17 MED ORDER — SACCHAROMYCES BOULARDII 250 MG PO CAPS: ORAL_CAPSULE | ORAL | Status: AC

## 2019-05-17 MED ORDER — IBUPROFEN 200 MG PO TABS: ORAL_TABLET | ORAL | Status: DC

## 2019-05-17 MED ORDER — OXYCODONE HCL 5 MG PO TABS: 5.0000 mg | ORAL_TABLET | Freq: Four times a day (QID) | ORAL | 0 refills | Status: DC | PRN

## 2019-05-17 MED ORDER — LIP MEDEX EX OINT
TOPICAL_OINTMENT | CUTANEOUS | Status: AC
  Administered 2019-05-17: 12:00:00
  Filled 2019-05-17: qty 7

## 2019-05-17 MED ORDER — OXYCODONE HCL 5 MG PO TABS
5.0000 mg | ORAL_TABLET | ORAL | Status: DC | PRN
  Administered 2019-05-17: 10 mg via ORAL
  Filled 2019-05-17: qty 2

## 2019-05-17 MED ORDER — SACCHAROMYCES BOULARDII 250 MG PO CAPS: 250.0000 mg | ORAL_CAPSULE | Freq: Two times a day (BID) | ORAL | Status: DC

## 2019-05-17 MED ORDER — IBUPROFEN 200 MG PO TABS: 600.0000 mg | ORAL_TABLET | Freq: Four times a day (QID) | ORAL | Status: DC | PRN

## 2019-05-17 MED ORDER — IBUPROFEN 200 MG PO TABS
600.0000 mg | ORAL_TABLET | Freq: Four times a day (QID) | ORAL | Status: DC | PRN
  Administered 2019-05-17: 600 mg via ORAL
  Filled 2019-05-17: qty 3

## 2019-05-17 MED ORDER — ACETAMINOPHEN 500 MG PO TABS: ORAL_TABLET | ORAL | 0 refills | Status: DC

## 2019-05-17 NOTE — Discharge Summary (Addendum)
Physician Discharge Summary  Patient ID: Sonia Canaryndrea C Schou MRN: [id number] DOB/AGE: [birth year] 33 y.o.  Admit date: 05/15/2019 Discharge date: 05/17/2019  Admission Diagnoses:  Acute cholecystitis, cholelithiasis Hypertension -untreated  Hx migraines Obese BMI 35.4    Discharge Diagnoses:  Severe acute and chronic cholecystitis/cholelithiasis. Hypertension -untreated  Hx migraines Obese BMI 35.4   Active Problems:   Acute cholecystitis   PROCEDURES: Laparoscopic cholecystectomy with intraoperative cholangiogram, 05/16/2019, Dr. Alfredia Fergusonavid Cambelle Suchecki  Hospital Course:  Sonia Morris is a 33 y.o. (DOB: [birth year])  AA female whose primary care physician is Patient, No Pcp Per. [facility].  She has had diarrhea and vomiting since last Sunday, 7/5.  She has been out of work because of an injury to her left knee, so it sounds like she just sat at home.             Last night, she hurt more and got sicker and her mother [facility].  She has not been jaundiced.  She has had no prior episode like this.             She has no stomach, liver, colon disease.   She's had no prior surgery.  Her grandmother had gall bladder disease.  CT scan of abdomen - 05/15/2019 - Acute cholecystitis with markedly edematous appearance of the gallbladder wall and possible disruption of the wall. Moderate amount of associated pericholecystic fluid and fat stranding extending caudally. WBC - 17,500 - 05/15/2019  She was admitted on 05/15/19, and underwent surgery on 05/16/19.  Post op she is doing well, complaining of ongoing pain.  She was placed on Tylenol, ibuprofen and Oxycodone for pain.  She is doing better but still complaining of pain.  Dr. Ezzard StandingNewman reviewed her labs and it is his opinion she can go home on 4 more days of antibiotics for a total of 5 days post op.   We told her she could return to work in 2 weeks and she has a note to be out of work till then.  He port sites look good and she is tolerating her diet well.     Condition on DC:  Improved  CBC Latest Ref Rng & Units 05/17/2019 05/16/2019 05/15/2019  WBC 4.0 - 10.5 K/uL 17.0(H) 12.0(H) 17.5(H)  Hemoglobin 12.0 - 15.0 g/dL 4.0(J9.4(L) 8.1(X9.6(L) 10.8(L)  Hematocrit 36.0 - 46.0 % 31.1(L) 32.5(L) 34.9(L)  Platelets 150 - 400 K/uL 312 299 335   CMP Latest Ref Rng & Units 05/16/2019 05/15/2019 12/08/2018  Glucose 70 - 99 mg/dL 86 94 87  BUN 6 - 20 mg/dL 8 7 14   Creatinine 0.44 - 1.00 mg/dL 9.140.72 7.820.69 9.560.68  Sodium 135 - 145 mmol/L 135 134(L) 138  Potassium 3.5 - 5.1 mmol/L 3.7 3.7 4.2  Chloride 98 - 111 mmol/L 103 99 107  CO2 22 - 32 mmol/L 22 23 23   Calcium 8.9 - 10.3 mg/dL 8.3(L) 9.1 9.2  Total Protein 6.5 - 8.1 g/dL 7.1 2.1(H8.5(H) -  Total Bilirubin 0.3 - 1.2 mg/dL 0.8 1.1 -  Alkaline Phos 38 - 126 U/L 203(H) 226(H) -  AST 15 - 41 U/L 60(H) 88(H) -  ALT 0 - 44 U/L 107(H) 126(H) -    Disposition: Improved   Allergies as of 05/17/2019      Reactions   Vicodin [hydrocodone-acetaminophen] Itching      Medication List    STOP taking these medications   ciprofloxacin 0.3 % ophthalmic  solution Commonly known as: Ciloxan     TAKE these medications   acetaminophen 500 MG tablet Commonly known as: TYLENOL You can take 1000 mg of Tylenol every 8 hours for pain.  This is your first pain medication.  You can use it around the clock for the next couple days, and start using it every 8 hours as needed after pain is controlled better. You can buy this over the counter at any drug store.   Do not take more than 4000 mg of Tylenol(acetaminophen) per day, it can harm your liver. What changed:   how much to take  how to take this  when to take this  reasons to take this  additional instructions   amoxicillin-clavulanate 875-125 MG tablet Commonly known as: AUGMENTIN Take 1 tablet by mouth every 12 (twelve) hours.   fluticasone 50 MCG/ACT nasal spray Commonly known as: FLONASE Place 2 sprays  into both nostrils daily.   ibuprofen 200 MG tablet Commonly known as: ADVIL Your can take 2-3 tablets every 6 hours as needed for pain not relieved by plain Tylenol(acetaminophen.)  You can start this about 2 hours after your Tylenol(acetominophen) dose was started if you need additional pain relief.  Your can rotate the Tylenol & ibuprofen as your primary pain relief medicines.  If these are not effective you can then use the oxycodone.  You can buy the ibuprofen over the counter at any drug store.   oxyCODONE 5 MG immediate release tablet Commonly known as: Oxy IR/ROXICODONE Take 1 tablet (5 mg total) by mouth every 6 (six) hours as needed for severe pain or breakthrough pain (pain not relieved by Tylenol and ibuprofen).   saccharomyces boulardii 250 MG capsule Commonly known as: FLORASTOR This is a probiotic to help replace the good bacteria the antibiotics kill.  Your can buy this at any drugstore over the counter where the stool products are.  Brand and type isn't real important.  Pick one, and follow package instructions for the one month.      Follow-up [facility] [place]. Call on 06/01/2019.   Specialty: General Surgery Why: 7/28 at 10:15am Due to coronavirus we are decreasing foot traffic in office. Instead of coming to an appt a provider will call you at the above date/time. Send picture of your incision with your name/date of birth to [email or web address] Contact information: [address] [place] [phone]       Primary care doctor Follow up.   Why: you need to see your primary care physician and have them look at your blood pressure, and other medical issues.            SignedEarnstine Regal 05/17/2019, 1:29 PM   Agree with above.  Alphonsa Overall, MD, [facility] Surgery Pager: [phone] Office phone:  [phone]

## 2019-05-17 NOTE — Discharge Instructions (Signed)
[facility] ° °LAPAROSCOPIC SURGERY: POST OP INSTRUCTIONS °Always review your discharge instruction sheet given to you by the facility where your surgery was performed. °IF YOU HAVE DISABILITY OR FAMILY LEAVE FORMS, YOU MUST BRING THEM TO THE OFFICE FOR PROCESSING.   °DO NOT GIVE THEM TO YOUR DOCTOR. ° °PAIN CONTROL ° °1. First take acetaminophen (Tylenol) AND/or ibuprofen (Advil) to control your pain after surgery.  Follow directions on package.  Taking acetaminophen (Tylenol) and/or ibuprofen (Advil) regularly after surgery will help to control your pain and lower the amount of prescription pain medication you may need.  You should not take more than 4,000 mg (4 grams) of acetaminophen (Tylenol) in 24 hours.  You should not take ibuprofen (Advil), aleve, motrin, naprosyn or other NSAIDS if you have a history of stomach ulcers or chronic kidney disease.  °2. A prescription for pain medication may be given to you upon discharge.  Take your pain medication as prescribed, if you still have uncontrolled pain after taking acetaminophen (Tylenol) or ibuprofen (Advil). °3. Use ice packs to help control pain. °4. If you need a refill on your pain medication, please contact your pharmacy.  They will contact our office to request authorization. Prescriptions will not be filled after 5pm or on week-ends. ° °HOME MEDICATIONS °5. Take your usually prescribed medications unless otherwise directed. ° °DIET °6. You should follow a light diet the first few days after arrival home.  Be sure to include lots of fluids daily. Avoid fatty, fried foods.  ° °CONSTIPATION °7. It is common to experience some constipation after surgery and if you are taking pain medication.  Increasing fluid intake and taking a stool softener (such as Colace) will usually help or prevent this problem from occurring.  A mild laxative (Milk of Magnesia or Miralax) should be taken according to package instructions if there are no bowel  movements after 48 hours. ° °WOUND/INCISION CARE °8. Most patients will experience some swelling and bruising in the area of the incisions.  Ice packs will help.  Swelling and bruising can take several days to resolve.  °9. Unless discharge instructions indicate otherwise, follow guidelines below  °a. STERI-STRIPS - you may remove your outer bandages 48 hours after surgery, and you may shower at that time.  You have steri-strips (small skin tapes) in place directly over the incision.  These strips should be left on the skin for 7-10 days.   °b. DERMABOND/SKIN GLUE - you may shower in 24 hours.  The glue will flake off over the next 2-3 weeks. °10. Any sutures or staples will be removed at the office during your follow-up visit. ° °ACTIVITIES °11. You may resume regular (light) daily activities beginning the next day--such as daily self-care, walking, climbing stairs--gradually increasing activities as tolerated.  You may have sexual intercourse when it is comfortable.  Refrain from any heavy lifting or straining until approved by your doctor. °a. You may drive when you are no longer taking prescription pain medication, you can comfortably wear a seatbelt, and you can safely maneuver your car and apply brakes. ° °FOLLOW-UP °12. You should see your doctor in the office for a follow-up appointment approximately 2-3 weeks after your surgery.  You should have been given your post-op/follow-up appointment when your surgery was scheduled.  If you did not receive a post-op/follow-up appointment, make sure that you call for this appointment within a day or two after you arrive home to insure a convenient appointment time. ° °  OTHER INSTRUCTIONS ° °WHEN TO CALL YOUR DOCTOR: °1. Fever over 101.0 °2. Inability to urinate °3. Continued bleeding from incision. °4. Increased pain, redness, or drainage from the incision. °5. Increasing abdominal pain ° °The clinic staff is available to answer your questions during regular business  hours.  Please don’t hesitate to call and ask to speak to one of the nurses for clinical concerns.  If you have a medical emergency, go to the nearest emergency room or call 911.  A surgeon from [facility] is always on call at the hospital. °[address], [address], [place], [place]  [address] ? [address] [address], , [place]   [address] °[phone] ? [phone] ? FAX [phone] ° ° ° °

## 2019-05-17 NOTE — Progress Notes (Addendum)
1 Day Post-Op    CC: Abdominal pain  Subjective: She is having a lot of abdominal pain, although she says her pain is oxycodone.  She is only been up to the bathroom.  She did tolerate some breakfast this morning.  She is a [employer].  Her allergy to Vicodin is the hydrocodone.  She is tolerant to Tylenol and oxycodone.  Objective: Vital signs in last 24 hours: Temp:  [97.9 F (36.6 C)-99.3 F (37.4 C)] 98.3 F (36.8 C) (07/13 0523) Pulse Rate:  [80-102] 80 (07/13 0523) Resp:  [16-31] 16 (07/13 0523) BP: (125-152)/(79-100) 129/89 (07/13 0523) SpO2:  [95 %-100 %] 99 % (07/13 0523) Last BM Date: 05/14/19 3436 IV recorded Nothing p.o. recorded 1100 urine Afebrile blood pressures is mildly elevated. WBC still elevated at 17,000.  Intake/Output from previous day: 07/12 0701 - 07/13 0700 In: 3436.3 [I.V.:3312.7; IV Piggyback:123.6] Out: 1150 [Urine:1100; Blood:50] Intake/Output this shift: No intake/output data recorded.  General appearance: alert, cooperative and no distress GI: Soft, sore, port sites all look good.  Tolerated about half of a regular breakfast this morning.  Lab Results:  Recent Labs    05/16/19 0404 05/17/19 0351  WBC 12.0* 17.0*  HGB 9.6* 9.4*  HCT 32.5* 31.1*  PLT 299 312    BMET Recent Labs    05/15/19 1602 05/16/19 0404  NA 134* 135  K 3.7 3.7  CL 99 103  CO2 23 22  GLUCOSE 94 86  BUN 7 8  CREATININE 0.69 0.72  CALCIUM 9.1 8.3*   PT/INR No results for input(s): LABPROT, INR in the last 72 hours.  Recent Labs  Lab 05/15/19 1602 05/16/19 0404  AST 88* 60*  ALT 126* 107*  ALKPHOS 226* 203*  BILITOT 1.1 0.8  PROT 8.5* 7.1  ALBUMIN 3.1* 2.6*     Lipase     Component Value Date/Time   LIPASE 35 05/15/2019 1602   . dextrose 5 % and 0.45 % NaCl with KCl 20 mEq/L 75 mL/hr at 05/16/19 1535  . piperacillin-tazobactam (ZOSYN)  IV 3.375 g (05/17/19 0848)     Medications:   Assessment/Plan Hypertension -untreated  Hx  migraines Obese BMI 35.4  Severe acute and chronic cholecystitis/cholelithiasis. Laparoscopic cholecystectomy with intraoperative cholangiogram, 05/16/2019, Dr. Alphonsa Overall  -Leukocytosis 17.5(7/11)>> 12.0(7/12) >>17.0(7/13) FEN: IV fluids/regular diet ID: Zosyn 7/11 >> day 3 DVT:SCD's Follow up:  [facility]  Plan: We will add Tylenol and ibuprofen to her oxycodone.  Mobilize, see how she does.  Her WBC is still elevated at 17,000.  She has a dose of Zosyn going now.    We plan to convert her to Augmentin for total of 5 days postop antibiotics.  I will leave her on Zosyn while she is here.    Will discuss with Dr. Lucia Gaskins before discharge.     LOS: 1 day    JENNINGS,WILLARD 05/17/2019 [phone]  Agree with above. She should do well at home. Agree with antibiotics.  Alphonsa Overall, MD, [facility] Surgery Pager: [phone] Office phone:  [phone]

## 2019-05-17 NOTE — Progress Notes (Signed)
Discharge instructions discussed with patient, verbalized agreement,

## 2019-06-03 ENCOUNTER — Ambulatory Visit
Admission: RE | Admit: 2019-06-03 | Discharge: 2019-06-03 | Disposition: A | Discharge status: Home / Self Care | Payer: Worker's Compensation | Source: Ambulatory Visit | Attending: Family Medicine | Admitting: Family Medicine

## 2019-06-03 ENCOUNTER — Other Ambulatory Visit: Payer: Self-pay | Admitting: Family Medicine

## 2019-06-03 DIAGNOSIS — M25562 Pain in left knee: Secondary | ICD-10-CM

## 2020-10-30 ENCOUNTER — Emergency Department (HOSPITAL_BASED_OUTPATIENT_CLINIC_OR_DEPARTMENT_OTHER)
Admission: EM | Admit: 2020-10-30 | Discharge: 2020-10-30 | Disposition: A | Discharge status: Home / Self Care | Payer: 59 | Attending: Emergency Medicine | Admitting: Emergency Medicine

## 2020-10-30 ENCOUNTER — Other Ambulatory Visit: Payer: Self-pay

## 2020-10-30 ENCOUNTER — Encounter (HOSPITAL_BASED_OUTPATIENT_CLINIC_OR_DEPARTMENT_OTHER): Payer: Self-pay

## 2020-10-30 DIAGNOSIS — U071 COVID-19: Secondary | ICD-10-CM | POA: Diagnosis not present

## 2020-10-30 DIAGNOSIS — I10 Essential (primary) hypertension: Secondary | ICD-10-CM | POA: Insufficient documentation

## 2020-10-30 DIAGNOSIS — R519 Headache, unspecified: Secondary | ICD-10-CM | POA: Diagnosis present

## 2020-10-30 LAB — RESP PANEL BY RT-PCR (FLU A&B, COVID) ARPGX2
Influenza A by PCR: NEGATIVE
Influenza B by PCR: NEGATIVE
SARS Coronavirus 2 by RT PCR: POSITIVE — AB

## 2020-10-30 NOTE — ED Provider Notes (Signed)
[facility] EMERGENCY DEPARTMENT Provider Note   CSN: [id number] Arrival date & time: 10/30/20  1302     History Chief Complaint  Patient presents with  . Headache    Sonia Morris is a 34 y.o. female.  34 year old female with past medical history of hypertension presents with complaint of headache, body aches, cough and sore throat as well as diarrhea.  Patient states symptoms started 2 days ago, exposed to someone with similar symptoms.  Patient is fully vaccinated against COVID-19, is a non-smoker, no history of asthma or chronic lung disease.  No other complaints or concerns.  Sonia Morris was evaluated in Emergency Department on 10/30/2020 for the symptoms described in the history of present illness. She was evaluated in the context of the global COVID-19 pandemic, which necessitated consideration that the patient might be at risk for infection with the SARS-CoV-2 virus that causes COVID-19. Institutional protocols and algorithms that pertain to the evaluation of patients at risk for COVID-19 are in a state of rapid change based on information released by regulatory bodies including the CDC and federal and state organizations. These policies and algorithms were followed during the patient's care in the ED.         Past Medical History:  Diagnosis Date  . Agenesis of uterus   . Hypertension   . Migraine   . Pseudoseizures (HCC)    "elated to being upset"    Patient Active Problem List   Diagnosis Date Noted  . Acute cholecystitis 05/15/2019  . Vaginal bleeding 07/16/2013  . BV (bacterial vaginosis) 07/16/2013  . General medical examination 05/17/2011  . 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 Cystocele 04/12/2011  . Migraines 04/12/2011    Past Surgical History:  Procedure Laterality Date  . CHOLECYSTECTOMY N/A 05/16/2019   Procedure: LAPAROSCOPIC CHOLECYSTECTOMY WITH INTRAOPERATIVE CHOLANGIOGRAM;  Surgeon: Ovidio Kin, MD;  Location: [facility];  Service:  General;  Laterality: N/A;  . NO PAST SURGERIES       OB History   No obstetric history on file.     Family History  Problem Relation Age of Onset  . Hypertension Mother     Social History   Tobacco Use  . Smoking status: Never Smoker  . Smokeless tobacco: Never Used  Vaping Use  . Vaping Use: Never used  Substance Use Topics  . Alcohol use: Yes    Comment: occ  . Drug use: No    Home Medications Prior to Admission medications   Medication Sig Start Date End Date Taking? Authorizing Provider  acetaminophen (TYLENOL) 500 MG tablet You can take 1000 mg of Tylenol every 8 hours for pain.  This is your first pain medication.  You can use it around the clock for the next couple days, and start using it every 8 hours as needed after pain is controlled better. You can buy this over the counter at any drug store.   Do not take more than 4000 mg of Tylenol(acetaminophen) per day, it can harm your liver. 05/17/19   Sherrie George, PA-C  amoxicillin-clavulanate (AUGMENTIN) 875-125 MG tablet Take 1 tablet by mouth every 12 (twelve) hours. 05/17/19   Sherrie George, PA-C  fluticasone [facility]) 50 MCG/ACT nasal spray Place 2 sprays into both nostrils daily. Patient not taking: Reported on 05/15/2019 01/25/19   Couture, Cortni S, PA-C  ibuprofen (ADVIL) 200 MG tablet Your can take 2-3 tablets every 6 hours as needed for pain not relieved by plain Tylenol(acetaminophen.)  You can start  this about 2 hours after your Tylenol(acetominophen) dose was started if you need additional pain relief.  Your can rotate the Tylenol & ibuprofen as your primary pain relief medicines.  If these are not effective you can then use the oxycodone.  You can buy the ibuprofen over the counter at any drug store. 05/17/19   Sherrie George, PA-C  oxyCODONE (OXY IR/ROXICODONE) 5 MG immediate release tablet Take 1 tablet (5 mg total) by mouth every 6 (six) hours as needed for severe pain or breakthrough pain (pain not  relieved by Tylenol and ibuprofen). 05/17/19   Sherrie George, PA-C  saccharomyces boulardii (FLORASTOR) 250 MG capsule This is a probiotic to help replace the good bacteria the antibiotics kill.  Your can buy this at any drugstore over the counter where the stool products are.  Brand and type isn't real important.  Pick one, and follow package instructions for the one month. 05/17/19   Sherrie George, PA-C    Allergies    Vicodin [hydrocodone-acetaminophen]  Review of Systems   Review of Systems  Constitutional: Negative for chills and fever.  HENT: Positive for congestion and sore throat.   Respiratory: Positive for cough.   Gastrointestinal: Positive for diarrhea. Negative for abdominal pain, nausea and vomiting.  Musculoskeletal: Positive for arthralgias and myalgias.  Skin: Negative for rash.  Allergic/Immunologic: Negative for immunocompromised state.  Neurological: Positive for headaches. Negative for weakness.  Hematological: Negative for adenopathy.  All other systems reviewed and are negative.   Physical Exam Updated Vital Signs BP (!) 158/109 (BP Location: Right Arm)   Pulse 62   Temp 98.9 F (37.2 C) (Oral)   Resp 16   Ht 5\' 9"  (1.753 m)   Wt 111.1 kg   SpO2 100%   BMI 36.18 kg/m   Physical Exam Vitals and nursing note reviewed.  Constitutional:      General: She is not in acute distress.    Appearance: She is well-developed and well-nourished. She is not diaphoretic.  HENT:     Head: Normocephalic and atraumatic.     Right Ear: Tympanic membrane and ear canal normal.     Left Ear: Tympanic membrane and ear canal normal.  Eyes:     Conjunctiva/sclera: Conjunctivae normal.  Cardiovascular:     Rate and Rhythm: Normal rate and regular rhythm.     Pulses: Normal pulses.     Heart sounds: Normal heart sounds.  Pulmonary:     Effort: Pulmonary effort is normal.     Breath sounds: Normal breath sounds.  Musculoskeletal:     Cervical back: Neck supple.   Lymphadenopathy:     Cervical: No cervical adenopathy.  Skin:    General: Skin is warm and dry.     Findings: No erythema or rash.  Neurological:     Mental Status: She is alert and oriented to person, place, and time.  Psychiatric:        Mood and Affect: Mood and affect normal.        Behavior: Behavior normal.     ED Results / Procedures / Treatments   Labs (all labs ordered are listed, but only abnormal results are displayed) Labs Reviewed  RESP PANEL BY RT-PCR (FLU A&B, COVID) ARPGX2 - Abnormal; Notable for the following components:      Result Value   SARS Coronavirus 2 by RT PCR POSITIVE (*)    All other components within normal limits    EKG None  Radiology No results found.  Procedures  Procedures (including critical care time)  Medications Ordered in ED Medications - No data to display  ED Course  I have reviewed the triage vital signs and the nursing notes.  Pertinent labs & imaging results that were available during my care of the patient were reviewed by me and considered in my medical decision making (see chart for details).  Clinical Course as of 10/30/20 1802  Mon Oct 30, 2020  [id number] 34 year old female with complaint of Covid-like illness as above.  Patient has tested positive for Covid.  Patient's blood pressure is elevated in triage, slightly improved on repeat, does have history of hypertension. Patient is well-appearing, vitals otherwise reassuring.  Discussed positive Covid test with patient, advised to take Motrin and Tylenol, Coricidin HBP as needed as directed.  Return to ER for severe concerning symptoms. [LM]    Clinical Course User Index [LM] Alden Hipp   MDM Rules/Calculators/A&P                          Final Clinical Impression(s) / ED Diagnoses Final diagnoses:  COVID-19    Rx / DC Orders ED Discharge Orders    None       Jeannie Fend, PA-C 10/30/20 1802    Terald Sleeper, MD 10/31/20 [phone]

## 2020-10-30 NOTE — ED Notes (Signed)
ED Provider at bedside. 

## 2020-10-30 NOTE — Discharge Instructions (Signed)
Home to quarantine.  Take Coricidin HBP, Motrin, Tylenol as needed as directed for symptom relief. Recheck with your doctor as needed, return to ER for severe concerning symptoms.

## 2020-10-30 NOTE — ED Triage Notes (Signed)
Pt reports being exposed to someone on Saturday that is covid positive. Pt reports headache, sore throat, runny nose, body aches, cough and diarrhea since saturday.

## 2021-04-25 ENCOUNTER — Encounter (HOSPITAL_BASED_OUTPATIENT_CLINIC_OR_DEPARTMENT_OTHER): Payer: Self-pay

## 2021-04-25 ENCOUNTER — Emergency Department (HOSPITAL_BASED_OUTPATIENT_CLINIC_OR_DEPARTMENT_OTHER): Payer: Self-pay

## 2021-04-25 ENCOUNTER — Emergency Department (HOSPITAL_BASED_OUTPATIENT_CLINIC_OR_DEPARTMENT_OTHER)
Admission: EM | Admit: 2021-04-25 | Discharge: 2021-04-25 | Disposition: A | Discharge status: Home / Self Care | Payer: Self-pay | Attending: Emergency Medicine | Admitting: Emergency Medicine

## 2021-04-25 ENCOUNTER — Other Ambulatory Visit: Payer: Self-pay

## 2021-04-25 DIAGNOSIS — R42 Dizziness and giddiness: Secondary | ICD-10-CM | POA: Insufficient documentation

## 2021-04-25 DIAGNOSIS — R110 Nausea: Secondary | ICD-10-CM | POA: Insufficient documentation

## 2021-04-25 DIAGNOSIS — I10 Essential (primary) hypertension: Secondary | ICD-10-CM | POA: Insufficient documentation

## 2021-04-25 LAB — URINALYSIS, ROUTINE W REFLEX MICROSCOPIC
Bilirubin Urine: NEGATIVE
Glucose, UA: NEGATIVE mg/dL
Ketones, ur: NEGATIVE mg/dL
Leukocytes,Ua: NEGATIVE
Nitrite: NEGATIVE
Protein, ur: NEGATIVE mg/dL
Specific Gravity, Urine: 1.02 (ref 1.005–1.030)
pH: 7 (ref 5.0–8.0)

## 2021-04-25 LAB — COMPREHENSIVE METABOLIC PANEL
ALT: 24 U/L (ref 0–44)
AST: 19 U/L (ref 15–41)
Albumin: 3.6 g/dL (ref 3.5–5.0)
Alkaline Phosphatase: 74 U/L (ref 38–126)
Anion gap: 8 (ref 5–15)
BUN: 12 mg/dL (ref 6–20)
CO2: 26 mmol/L (ref 22–32)
Calcium: 8.8 mg/dL — ABNORMAL LOW (ref 8.9–10.3)
Chloride: 103 mmol/L (ref 98–111)
Creatinine, Ser: 0.7 mg/dL (ref 0.44–1.00)
GFR, Estimated: >60 mL/min (ref >60)
Glucose, Bld: 89 mg/dL (ref 70–99)
Potassium: 3.9 mmol/L (ref 3.5–5.1)
Sodium: 137 mmol/L (ref 135–145)
Total Bilirubin: 0.3 mg/dL (ref 0.3–1.2)
Total Protein: 7.9 g/dL (ref 6.5–8.1)

## 2021-04-25 LAB — PREGNANCY, URINE: Preg Test, Ur: NEGATIVE

## 2021-04-25 LAB — CBC WITH DIFFERENTIAL/PLATELET
Abs Immature Granulocytes: 0.08 10*3/uL — ABNORMAL HIGH (ref 0.00–0.07)
Basophils Absolute: 0.1 10*3/uL (ref 0.0–0.1)
Basophils Relative: 1 %
Eosinophils Absolute: 0.2 10*3/uL (ref 0.0–0.5)
Eosinophils Relative: 2 %
HCT: 35.9 % — ABNORMAL LOW (ref 36.0–46.0)
Hemoglobin: 11.3 g/dL — ABNORMAL LOW (ref 12.0–15.0)
Immature Granulocytes: 1 %
Lymphocytes Relative: 25 %
Lymphs Abs: 2.5 10*3/uL (ref 0.7–4.0)
MCH: 23 pg — ABNORMAL LOW (ref 26.0–34.0)
MCHC: 31.5 g/dL (ref 30.0–36.0)
MCV: 73.1 fL — ABNORMAL LOW (ref 80.0–100.0)
Monocytes Absolute: 0.8 10*3/uL (ref 0.1–1.0)
Monocytes Relative: 8 %
Neutro Abs: 6.6 10*3/uL (ref 1.7–7.7)
Neutrophils Relative %: 63 %
Platelets: 347 10*3/uL (ref 150–400)
RBC: 4.91 MIL/uL (ref 3.87–5.11)
RDW: 14.9 % (ref 11.5–15.5)
WBC: 10.3 10*3/uL (ref 4.0–10.5)
nRBC: 0 % (ref 0.0–0.2)

## 2021-04-25 LAB — URINALYSIS, MICROSCOPIC (REFLEX): WBC, UA: NONE SEEN WBC/hpf (ref 0–5)

## 2021-04-25 MED ORDER — ACETAMINOPHEN 325 MG PO TABS
650.0000 mg | ORAL_TABLET | Freq: Once | ORAL | Status: AC
  Administered 2021-04-25: 650 mg via ORAL
  Filled 2021-04-25: qty 2

## 2021-04-25 MED ORDER — ONDANSETRON HCL 4 MG/2ML IJ SOLN
4.0000 mg | Freq: Once | INTRAMUSCULAR | Status: AC
  Administered 2021-04-25: 4 mg via INTRAVENOUS
  Filled 2021-04-25: qty 2

## 2021-04-25 MED ORDER — SODIUM CHLORIDE 0.9 % IV BOLUS
1000.0000 mL | Freq: Once | INTRAVENOUS | Status: AC
  Administered 2021-04-25: 1000 mL via INTRAVENOUS

## 2021-04-25 NOTE — ED Provider Notes (Signed)
[facility] EMERGENCY DEPARTMENT Provider Note   CSN: [id number] Arrival date & time: 04/25/21  1839     History Chief Complaint  Patient presents with   Dizziness    Sonia Morris is a 35 y.o. female.  Patient here with some lightheadedness, headaches, possible syncope event.  Patient states that she was at work today not feeling well and feeling lightheaded.  Has not had much to eat or drink.  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  Not sure if she hit her head.  She is not on blood thinners.  The history is provided by the patient.  Dizziness Quality:  Lightheadedness Severity:  Mild Onset quality:  Gradual Duration:  6 hours Timing:  Intermittent Progression:  Waxing and waning Chronicity:  New Relieved by: rest. Worsened by:  Movement Associated symptoms: headaches and nausea   Associated symptoms: no blood in stool, no chest pain, no diarrhea, no hearing loss, no palpitations, no shortness of breath, no syncope, no tinnitus, no vision changes, no vomiting and no weakness       Past Medical History:  Diagnosis Date   Agenesis of uterus    Hypertension    Migraine    Pseudoseizures (HCC)    "elated to being upset"    Patient Active Problem List   Diagnosis Date Noted   Acute cholecystitis 05/15/2019   Vaginal bleeding 07/16/2013   BV (bacterial vaginosis) 07/16/2013   General medical examination 05/17/2011   Agenesis of uterus 04/12/2011   Cystocele 04/12/2011   Migraines 04/12/2011    Past Surgical History:  Procedure Laterality Date   CHOLECYSTECTOMY N/A 05/16/2019   Procedure: LAPAROSCOPIC CHOLECYSTECTOMY WITH INTRAOPERATIVE CHOLANGIOGRAM;  Surgeon: Ovidio KinNewman, David, MD;  Location: [facility];  Service: General;  Laterality: N/A;   KNEE SURGERY     NO PAST SURGERIES       OB History   No obstetric history on file.     Family History  Problem Relation Age  of Onset   Hypertension Mother     Social History   Tobacco Use   Smoking status: Never   Smokeless tobacco: Never  Vaping Use   Vaping Use: Never used  Substance Use Topics   Alcohol use: Yes    Comment: occ   Drug use: No    Home Medications Prior to Admission medications   Medication Sig Start Date End Date Taking? Authorizing Provider  acetaminophen (TYLENOL) 500 MG tablet You can take 1000 mg of Tylenol every 8 hours for pain.  This is your first pain medication.  You can use it around the clock for the next couple days, and start using it every 8 hours as needed after pain is controlled better. You can buy this over the counter at any drug store.   Do not take more than 4000 mg of Tylenol(acetaminophen) per day, it can harm your liver. 05/17/19   Sherrie GeorgeJennings, Willard, PA-C  amoxicillin-clavulanate (AUGMENTIN) 875-125 MG tablet Take 1 tablet by mouth every 12 (twelve) hours. 05/17/19   Sherrie GeorgeJennings, Willard, PA-C  fluticasone [facility](FLONASE) 50 MCG/ACT nasal spray Place 2 sprays into both nostrils daily. Patient not taking: Reported on 05/15/2019 01/25/19   Couture, Cortni S, PA-C  ibuprofen (ADVIL) 200 MG tablet Your can take 2-3 tablets every 6 hours as needed for pain not relieved by plain Tylenol(acetaminophen.)  You can start this about 2 hours after your Tylenol(acetominophen) dose  was started if you need additional pain relief.  Your can rotate the Tylenol & ibuprofen as your primary pain relief medicines.  If these are not effective you can then use the oxycodone.  You can buy the ibuprofen over the counter at any drug store. 05/17/19   Sherrie George, PA-C  oxyCODONE (OXY IR/ROXICODONE) 5 MG immediate release tablet Take 1 tablet (5 mg total) by mouth every 6 (six) hours as needed for severe pain or breakthrough pain (pain not relieved by Tylenol and ibuprofen). 05/17/19   Sherrie George, PA-C  saccharomyces boulardii (FLORASTOR) 250 MG capsule This is a probiotic to help replace the good  bacteria the antibiotics kill.  Your can buy this at any drugstore over the counter where the stool products are.  Brand and type isn't real important.  Pick one, and follow package instructions for the one month. 05/17/19   Sherrie George, PA-C    Allergies    Vicodin [hydrocodone-acetaminophen]  Review of Systems   Review of Systems  Constitutional:  Negative for chills and fever.  HENT:  Negative for ear pain, hearing loss, sore throat and tinnitus.   Eyes:  Negative for pain and visual disturbance.  Respiratory:  Negative for cough and shortness of breath.   Cardiovascular:  Negative for chest pain, palpitations and syncope.  Gastrointestinal:  Positive for nausea. Negative for abdominal pain, blood in stool, diarrhea and vomiting.  Genitourinary:  Negative for dysuria and hematuria.  Musculoskeletal:  Negative for arthralgias and back pain.  Skin:  Negative for color change and rash.  Neurological:  Positive for dizziness, light-headedness and headaches. Negative for seizures, syncope, facial asymmetry, speech difficulty, weakness and numbness.  All other systems reviewed and are negative.  Physical Exam Updated Vital Signs  ED Triage Vitals  Enc Vitals Group     BP 04/25/21 1853 (!) 160/119     Pulse Rate 04/25/21 1852 81     Resp 04/25/21 1852 16     Temp 04/25/21 1852 98.5 F (36.9 C)     Temp Source 04/25/21 1852 Oral     SpO2 04/25/21 1852 100 %     Weight 04/25/21 1853 250 lb (113.4 kg)     Height 04/25/21 1853 5\' 9"  (1.753 m)     Head Circumference --      Peak Flow --      Pain Score 04/25/21 1851 10     Pain Loc --      Pain Edu? --      Excl. in GC? --      Physical Exam Vitals and nursing note reviewed.  Constitutional:      General: She is not in acute distress.    Appearance: She is well-developed. She is not ill-appearing.  HENT:     Head: Normocephalic and atraumatic.     Nose: Nose normal.     Mouth/Throat:     Mouth: Mucous membranes are  moist.  Eyes:     Extraocular Movements: Extraocular movements intact.     Conjunctiva/sclera: Conjunctivae normal.     Pupils: Pupils are equal, round, and reactive to light.  Cardiovascular:     Rate and Rhythm: Normal rate and regular rhythm.     Pulses: Normal pulses.     Heart sounds: Normal heart sounds. No murmur heard. Pulmonary:     Effort: Pulmonary effort is normal. No respiratory distress.     Breath sounds: Normal breath sounds.  Abdominal:     Palpations: Abdomen is  soft.     Tenderness: There is no abdominal tenderness.  Musculoskeletal:     Cervical back: Normal range of motion and neck supple. No tenderness.  Skin:    General: Skin is warm and dry.     Capillary Refill: Capillary refill takes less than 2 seconds.  Neurological:     General: No focal deficit present.     Mental Status: She is alert and oriented to person, place, and time.     Cranial Nerves: No cranial nerve deficit.     Sensory: No sensory deficit.     Motor: No weakness.     Coordination: Coordination normal.     Comments: 5+ out of 5 strength throughout, normal sensation, no drift, normal finger-nose-finger  Psychiatric:        Mood and Affect: Mood normal.    ED Results / Procedures / Treatments   Labs (all labs ordered are listed, but only abnormal results are displayed) Labs Reviewed  CBC WITH DIFFERENTIAL/PLATELET - Abnormal; Notable for the following components:      Result Value   Hemoglobin 11.3 (*)    HCT 35.9 (*)    MCV 73.1 (*)    MCH 23.0 (*)    Abs Immature Granulocytes 0.08 (*)    All other components within normal limits  COMPREHENSIVE METABOLIC PANEL - Abnormal; Notable for the following components:   Calcium 8.8 (*)    All other components within normal limits  URINALYSIS, ROUTINE W REFLEX MICROSCOPIC - Abnormal; Notable for the following components:   Hgb urine dipstick MODERATE (*)    All other components within normal limits  URINALYSIS, MICROSCOPIC (REFLEX) -  Abnormal; Notable for the following components:   Bacteria, UA FEW (*)    All other components within normal limits  PREGNANCY, URINE    EKG EKG Interpretation  Date/Time:  Wednesday April 25 2021 18:59:04 EDT Ventricular Rate:  81 PR Interval:  143 QRS Duration: 85 QT Interval:  390 QTC Calculation: 453 R Axis:   48 Text Interpretation: Sinus rhythm Confirmed by Virgina Norfolk ([id number]) on 04/25/2021 7:13:28 PM  Radiology DG Chest 2 View  Result Date: 04/25/2021 CLINICAL DATA:  Near syncope EXAM: CHEST - 2 VIEW COMPARISON:  12/08/2018 FINDINGS: Mild cardiomegaly. No focal opacity or pleural effusion. No pneumothorax. IMPRESSION: No active cardiopulmonary disease.  Mild cardiomegaly Electronically Signed   By: Jasmine Pang M.D.   On: 04/25/2021 19:24   CT Head Wo Contrast  Result Date: 04/25/2021 CLINICAL DATA:  35 year old female with syncope and fall. EXAM: CT HEAD WITHOUT CONTRAST TECHNIQUE: Contiguous axial images were obtained from the base of the skull through the vertex without intravenous contrast. COMPARISON:  Head CT dated 08/08/2012. FINDINGS: Brain: No evidence of acute infarction, hemorrhage, hydrocephalus, extra-axial collection or mass lesion/mass effect. Vascular: No hyperdense vessel or unexpected calcification. Skull: Normal. Negative for fracture or focal lesion. Sinuses/Orbits: The visualized paranasal sinuses and mastoid air cells are clear. Cerumen noted in the right external auditory canal. Other: None IMPRESSION: Normal noncontrast CT of the brain. Electronically Signed   By: Elgie Collard M.D.   On: 04/25/2021 19:19    Procedures Procedures   Medications Ordered in ED Medications  sodium chloride 0.9 % bolus 1,000 mL (1,000 mLs Intravenous New Bag/Given 04/25/21 1947)  ondansetron (ZOFRAN) injection 4 mg (4 mg Intravenous Given 04/25/21 1944)  acetaminophen (TYLENOL) tablet 650 mg (650 mg Oral Given 04/25/21 1937)    ED Course  I have reviewed the triage  vital  signs and the nursing notes.  Pertinent labs & imaging results that were available during my care of the patient were reviewed by me and considered in my medical decision making (see chart for details).    MDM Rules/Calculators/A&P                          GYSELLE Morris is here after likely syncopal event.  History of pseudoseizures, migraines.  States that she was not feeling too well today was feeling lightheaded and weak.  She had not had much to eat or drink.  She was at work and she started to feel lightheaded and dizzy and fell to the ground.  Not sure if she hit her head.  She feels better now but slightly nauseous and has a headache.  Neurologically she is intact.  Vital signs are normal.  Overall suspect vasovagal type event or may be atypical migraine.  May be vertigo.  Have low suspicion for head injury or meningitis.  We will get a head CT to evaluate given her fall.  Will check basic labs and focus on hydration and symptomatic management.  Will check for any electrolyte abnormality or dehydration.  Lab work overall unremarkable.  No significant anemia, electro abnormality, kidney injury.  CT scan of head normal.  X-ray of the chest shows no infection.  Overall suspect some dehydration/vasovagal type event.  Feeling better after IV fluids.  Discharged in good condition.  This chart was dictated using voice recognition software.  Despite best efforts to proofread,  errors can occur which can change the documentation meaning.  Final Clinical Impression(s) / ED Diagnoses Final diagnoses:  Lightheadedness    Rx / DC Orders ED Discharge Orders     None        Virgina Norfolk, DO 04/25/21 2051

## 2021-04-25 NOTE — ED Triage Notes (Signed)
Pt arrives via [facility] from work after having a near syncopal episode, 180/130 with EMS history of same has appointment with PCP for it next week, no neuro def. A&OX4 history of migraines NSR, CBG 96

## 2022-09-16 ENCOUNTER — Emergency Department (HOSPITAL_BASED_OUTPATIENT_CLINIC_OR_DEPARTMENT_OTHER)
Admission: EM | Admit: 2022-09-16 | Discharge: 2022-09-16 | Disposition: A | Discharge status: Home / Self Care | Payer: BC Managed Care – PPO | Attending: Emergency Medicine | Admitting: Emergency Medicine

## 2022-09-16 ENCOUNTER — Encounter (HOSPITAL_BASED_OUTPATIENT_CLINIC_OR_DEPARTMENT_OTHER): Payer: Self-pay | Admitting: Emergency Medicine

## 2022-09-16 DIAGNOSIS — U071 COVID-19: Secondary | ICD-10-CM | POA: Insufficient documentation

## 2022-09-16 DIAGNOSIS — J029 Acute pharyngitis, unspecified: Secondary | ICD-10-CM

## 2022-09-16 DIAGNOSIS — R590 Localized enlarged lymph nodes: Secondary | ICD-10-CM | POA: Insufficient documentation

## 2022-09-16 DIAGNOSIS — I10 Essential (primary) hypertension: Secondary | ICD-10-CM | POA: Insufficient documentation

## 2022-09-16 LAB — RESP PANEL BY RT-PCR (FLU A&B, COVID) ARPGX2
Influenza A by PCR: NEGATIVE
Influenza B by PCR: NEGATIVE
SARS Coronavirus 2 by RT PCR: POSITIVE — AB

## 2022-09-16 MED ORDER — LIDOCAINE VISCOUS HCL 2 % MT SOLN: 15.0000 mL | OROMUCOSAL | 0 refills | Status: AC | PRN

## 2022-09-16 NOTE — ED Triage Notes (Signed)
Headache, generalized body aches, sore throat, dry cough since Saturday.

## 2022-09-16 NOTE — ED Notes (Signed)
Discharge instructions reviewed with patient. Patient verbalizes understanding, no further questions at this time. Medications/prescriptions and follow up information provided. No acute distress noted at time of departure.  

## 2022-09-16 NOTE — ED Provider Notes (Signed)
[facility] Provider Note   CSN: [id number] Arrival date & time: 09/16/22  1334     History  Chief Complaint  Patient presents with   Generalized Body Aches   Sore Throat    Sonia Morris is a 36 y.o. female with past medical history significant for hypertension, intermittent migraines who presents with 2 days of headache, generalized body aches, sore throat, dry cough.  Patient denies any known sick contacts.  She reports that she is vaccinated against COVID but no recent vaccines for COVID, flu.  She denies any nausea, vomiting, documented fever at home.  She denies any diarrhea.  She denies any shortness of breath, chest pain.   Sore Throat       Home Medications Prior to Admission medications   Medication Sig Start Date End Date Taking? Authorizing Provider  lidocaine (XYLOCAINE) 2 % solution Use as directed 15 mLs in the mouth or throat as needed for mouth pain. 09/16/22  Yes Tashea Othman H, PA-C  acetaminophen (TYLENOL) 500 MG tablet You can take 1000 mg of Tylenol every 8 hours for pain.  This is your first pain medication.  You can use it around the clock for the next couple days, and start using it every 8 hours as needed after pain is controlled better. You can buy this over the counter at any drug store.   Do not take more than 4000 mg of Tylenol(acetaminophen) per day, it can harm your liver. 05/17/19   Earnstine Regal, PA-C  amoxicillin-clavulanate (AUGMENTIN) 875-125 MG tablet Take 1 tablet by mouth every 12 (twelve) hours. 05/17/19   Earnstine Regal, PA-C  fluticasone [facility]) 50 MCG/ACT nasal spray Place 2 sprays into both nostrils daily. Patient not taking: Reported on 05/15/2019 01/25/19   Couture, Cortni S, PA-C  ibuprofen (ADVIL) 200 MG tablet Your can take 2-3 tablets every 6 hours as needed for pain not relieved by plain Tylenol(acetaminophen.)  You can start this about 2 hours after your Tylenol(acetominophen) dose was  started if you need additional pain relief.  Your can rotate the Tylenol & ibuprofen as your primary pain relief medicines.  If these are not effective you can then use the oxycodone.  You can buy the ibuprofen over the counter at any drug store. 05/17/19   Earnstine Regal, PA-C  oxyCODONE (OXY IR/ROXICODONE) 5 MG immediate release tablet Take 1 tablet (5 mg total) by mouth every 6 (six) hours as needed for severe pain or breakthrough pain (pain not relieved by Tylenol and ibuprofen). 05/17/19   Earnstine Regal, PA-C  saccharomyces boulardii (FLORASTOR) 250 MG capsule This is a probiotic to help replace the good bacteria the antibiotics kill.  Your can buy this at any drugstore over the counter where the stool products are.  Brand and type isn't real important.  Pick one, and follow package instructions for the one month. 05/17/19   Earnstine Regal, PA-C      Allergies    Vicodin [hydrocodone-acetaminophen]    Review of Systems   Review of Systems  Constitutional:  Positive for chills.  HENT:  Positive for congestion and sore throat.   All other systems reviewed and are negative.   Physical Exam Updated Vital Signs BP (!) 168/111 (BP Location: Left Arm)   Pulse 88   Temp 98.8 F (37.1 C) (Oral)   Resp 18   Ht 5\' 9"  (1.753 m)   Wt 113.4 kg   SpO2 100%   BMI 36.92 kg/m  Physical  Exam Vitals and nursing note reviewed.  Constitutional:      General: She is not in acute distress.    Appearance: Normal appearance.  HENT:     Head: Normocephalic and atraumatic.     Mouth/Throat:     Comments: moderate posterior oropharynx erythema, without swelling, exudate. Uvula midline, tonsils 1+ bilaterally.  No trismus, stridor, evidence of PTA, floor of mouth swelling or redness.   Eyes:     General:        Right eye: No discharge.        Left eye: No discharge.  Neck:     Comments: Mild right-sided cervical lymphadenopathy Cardiovascular:     Rate and Rhythm: Normal rate and regular  rhythm.  Pulmonary:     Effort: Pulmonary effort is normal. No respiratory distress.     Comments: No wheezing, rhonchi, stridor, rales Musculoskeletal:        General: No deformity.  Lymphadenopathy:     Cervical: Cervical adenopathy present.  Skin:    General: Skin is warm and dry.  Neurological:     Mental Status: She is alert and oriented to person, place, and time.  Psychiatric:        Mood and Affect: Mood normal.        Behavior: Behavior normal.     ED Results / Procedures / Treatments   Labs (all labs ordered are listed, but only abnormal results are displayed) Labs Reviewed  RESP PANEL BY RT-PCR (FLU A&B, COVID) ARPGX2 - Abnormal; Notable for the following components:      Result Value   SARS Coronavirus 2 by RT PCR POSITIVE (*)    All other components within normal limits    EKG None  Radiology No results found.  Procedures Procedures    Medications Ordered in ED Medications - No data to display  ED Course/ Medical Decision Making/ A&P                           Medical Decision Making  This is an overall well-appearing 36 year old female who presents with concern for cold and flulike symptoms, cough, sore throat without any shortness of breath, chest pain for the last 2 days.  Patient denies any recent vaccination against COVID, flu.  My emergent differential diagnosis includes acute upper respiratory infection, COVID, flu, acute bronchitis, pneumonia, asthma or COPD exacerbation versus other.  I independently interpreted lab work including RVP which is positive for COVID.  On physical exam patient has a moderately erythematous posterior oropharynx with no evidence of tonsillar exudate, uvula is midline, patient can tolerate her own secretions without difficulty.  She has minimal right-sided cervical lymphadenopathy.  She has no accessory breath sounds, breathing easily with 100% saturation on room air.  Discussed with patient that given her lack of  comorbidities, and mild presentation of symptoms I would not recommend any antiviral treatment for her at this time, however will discharge with some viscous lidocaine to help with her throat pain.  Encouraged PCP follow-up, rest, quarantine per CDC precautions.  Patient understands and agrees to plan, and is discharged in stable condition at this time. Final Clinical Impression(s) / ED Diagnoses Final diagnoses:  COVID-19  Sore throat    Rx / DC Orders ED Discharge Orders          Ordered    lidocaine (XYLOCAINE) 2 % solution  As needed        09/16/22 1548  [place] 09/16/22 1548    Jacalyn Lefevre, MD 09/16/22 1627

## 2022-10-21 ENCOUNTER — Other Ambulatory Visit: Payer: Self-pay

## 2022-10-21 ENCOUNTER — Encounter (HOSPITAL_BASED_OUTPATIENT_CLINIC_OR_DEPARTMENT_OTHER): Payer: Self-pay | Admitting: Urology

## 2022-10-21 DIAGNOSIS — J101 Influenza due to other identified influenza virus with other respiratory manifestations: Secondary | ICD-10-CM | POA: Insufficient documentation

## 2022-10-21 DIAGNOSIS — Z1152 Encounter for screening for COVID-19: Secondary | ICD-10-CM | POA: Diagnosis not present

## 2022-10-21 DIAGNOSIS — I10 Essential (primary) hypertension: Secondary | ICD-10-CM | POA: Insufficient documentation

## 2022-10-21 DIAGNOSIS — R059 Cough, unspecified: Secondary | ICD-10-CM | POA: Diagnosis present

## 2022-10-21 LAB — RESP PANEL BY RT-PCR (RSV, FLU A&B, COVID)  RVPGX2
Influenza A by PCR: POSITIVE — AB
Influenza B by PCR: NEGATIVE
Resp Syncytial Virus by PCR: NEGATIVE
SARS Coronavirus 2 by RT PCR: NEGATIVE

## 2022-10-21 NOTE — ED Triage Notes (Signed)
States sinus congestion, body aches, cough, ha, runny nose since Wednesday   Exposure to COVID at work

## 2022-10-22 ENCOUNTER — Emergency Department (HOSPITAL_BASED_OUTPATIENT_CLINIC_OR_DEPARTMENT_OTHER)
Admission: EM | Admit: 2022-10-22 | Discharge: 2022-10-22 | Disposition: A | Discharge status: Home / Self Care | Payer: BC Managed Care – PPO | Attending: Emergency Medicine | Admitting: Emergency Medicine

## 2022-10-22 DIAGNOSIS — J101 Influenza due to other identified influenza virus with other respiratory manifestations: Secondary | ICD-10-CM

## 2022-10-22 MED ORDER — PROMETHAZINE-CODEINE 6.25-10 MG/5ML PO SOLN: 5.0000 mL | ORAL | 0 refills | Status: DC | PRN

## 2022-10-22 MED ORDER — PROMETHAZINE-CODEINE 6.25-10 MG/5ML PO SOLN: 5.0000 mL | ORAL | 0 refills | Status: AC | PRN

## 2022-10-22 NOTE — ED Provider Notes (Signed)
[facility] Provider Note: Lowella Dell, MD, FACEP  CSN: [id number] MRN: [id number] ARRIVAL: 10/21/22 at 2002 ROOM: MH05/[facility]05   CHIEF COMPLAINT  Cough   HISTORY OF PRESENT ILLNESS  10/22/22 1:31 AM Sonia Morris is a 36 y.o. female with 5 days of nasal congestion, rhinorrhea, sinus pressure, body aches, cough and headache.  Most of the symptoms have improved but the cough has persisted and has been severe enough to keep her awake.  She has taken over-the-counter medications without adequate relief.   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"    Past Surgical History:  Procedure Laterality Date   CHOLECYSTECTOMY N/A 05/16/2019   Procedure: LAPAROSCOPIC CHOLECYSTECTOMY WITH INTRAOPERATIVE CHOLANGIOGRAM;  Surgeon: Ovidio Kin, MD;  Location: [facility];  Service: General;  Laterality: N/A;   KNEE SURGERY     NO PAST SURGERIES      Family History  Problem Relation Age of Onset   Hypertension Mother     Social History   Tobacco Use   Smoking status: Never   Smokeless tobacco: Never  Vaping Use   Vaping Use: Never used  Substance Use Topics   Alcohol use: Yes    Comment: occ   Drug use: No    Prior to Admission medications   Medication Sig Start Date End Date Taking? Authorizing Provider  Promethazine-Codeine 6.25-10 MG/5ML SOLN Take 5 mLs by mouth every 4 (four) hours as needed (for cough). 10/22/22  Yes Fount Bahe, MD  lidocaine (XYLOCAINE) 2 % solution Use as directed 15 mLs in the mouth or throat as needed for mouth pain. 09/16/22   Prosperi, Christian H, PA-C  saccharomyces boulardii (FLORASTOR) 250 MG capsule This is a probiotic to help replace the good bacteria the antibiotics kill.  Your can buy this at any drugstore over the counter where the stool products are.  Brand and type isn't real important.  Pick one, and follow package instructions for the one  month. 05/17/19   Sherrie George, PA-C    Allergies Vicodin [hydrocodone-acetaminophen]   REVIEW OF SYSTEMS  Negative except as noted here or in the History of Present Illness.   PHYSICAL EXAMINATION  Initial Vital Signs Blood pressure (!) 157/107, pulse 90, temperature 98.2 F (36.8 C), temperature source Oral, resp. rate 20, height 5\' 9"  (1.753 m), weight 113.4 kg, SpO2 100 %.  Examination General: Well-developed, well-nourished female in no acute distress; appearance consistent with age of record HENT: normocephalic; atraumatic Eyes: Normal appearance Neck: supple Heart: regular rate and rhythm Lungs: clear to auscultation bilaterally Abdomen: soft; nondistended; nontender; bowel sounds present Extremities: No deformity; full range of motion Neurologic: Awake, alert and oriented; motor function intact in all extremities and symmetric; no facial droop Skin: Warm and dry Psychiatric: Normal mood and affect   RESULTS  Summary of this visit's results, reviewed and interpreted by myself:   EKG Interpretation  Date/Time:    Ventricular Rate:    PR Interval:    QRS Duration:   QT Interval:    QTC Calculation:   R Axis:     Text Interpretation:         Laboratory Studies: Results for orders placed or performed during the hospital encounter of 10/22/22 (from the past 24 hour(s))  Resp panel by RT-PCR (RSV, Flu A&B, Covid) Anterior Nasal Swab     Status: Abnormal  Collection Time: 10/21/22  8:17 PM   Specimen: Anterior Nasal Swab  Result Value Ref Range   SARS Coronavirus 2 by RT PCR NEGATIVE NEGATIVE   Influenza A by PCR POSITIVE (A) NEGATIVE   Influenza B by PCR NEGATIVE NEGATIVE   Resp Syncytial Virus by PCR NEGATIVE NEGATIVE   Imaging Studies: No results found.  ED COURSE and MDM  Nursing notes, initial and subsequent vitals signs, including pulse oximetry, reviewed and interpreted by myself.  Vitals:   10/21/22 2014 10/21/22 2014  BP:  (!) 157/107   Pulse:  90  Resp:  20  Temp:  98.2 F (36.8 C)  TempSrc:  Oral  SpO2:  100%  Weight: 113.4 kg   Height: 5\' 9"  (1.753 m)    Medications - No data to display  The patient states she is allergic to hydrocodone but believes she can take codeine.  We will treat her with promethazine/codeine antitussive.  She is well outside the window for Tamiflu.  Her lungs are clear and I do not believe she needs an albuterol inhaler.  PROCEDURES  Procedures   ED DIAGNOSES     ICD-10-CM   1. Influenza A with respiratory manifestations  J10.1          Aymar Whitfill, MD 10/22/22 [phone]

## 2023-02-04 ENCOUNTER — Encounter (HOSPITAL_BASED_OUTPATIENT_CLINIC_OR_DEPARTMENT_OTHER): Payer: Self-pay | Admitting: Emergency Medicine

## 2023-02-04 ENCOUNTER — Emergency Department (HOSPITAL_BASED_OUTPATIENT_CLINIC_OR_DEPARTMENT_OTHER)
Admission: EM | Admit: 2023-02-04 | Discharge: 2023-02-04 | Disposition: A | Discharge status: Home / Self Care | Payer: BC Managed Care – PPO | Attending: Emergency Medicine | Admitting: Emergency Medicine

## 2023-02-04 ENCOUNTER — Other Ambulatory Visit: Payer: Self-pay

## 2023-02-04 DIAGNOSIS — J309 Allergic rhinitis, unspecified: Secondary | ICD-10-CM | POA: Insufficient documentation

## 2023-02-04 DIAGNOSIS — J069 Acute upper respiratory infection, unspecified: Secondary | ICD-10-CM | POA: Insufficient documentation

## 2023-02-04 DIAGNOSIS — Z1152 Encounter for screening for COVID-19: Secondary | ICD-10-CM | POA: Insufficient documentation

## 2023-02-04 LAB — RESP PANEL BY RT-PCR (RSV, FLU A&B, COVID)  RVPGX2
Influenza A by PCR: NEGATIVE
Influenza B by PCR: NEGATIVE
Resp Syncytial Virus by PCR: NEGATIVE
SARS Coronavirus 2 by RT PCR: NEGATIVE

## 2023-02-04 MED ORDER — ALBUTEROL SULFATE HFA 108 (90 BASE) MCG/ACT IN AERS: 1.0000 | INHALATION_SPRAY | Freq: Four times a day (QID) | RESPIRATORY_TRACT | 0 refills | Status: AC | PRN

## 2023-02-04 MED ORDER — BENZONATATE 200 MG PO CAPS: 200.0000 mg | ORAL_CAPSULE | Freq: Three times a day (TID) | ORAL | 0 refills | Status: DC | PRN

## 2023-02-04 MED ORDER — LORATADINE 10 MG PO TABS: 10.0000 mg | ORAL_TABLET | Freq: Every day | ORAL | 0 refills | Status: AC

## 2023-02-04 MED ORDER — FLUTICASONE PROPIONATE 50 MCG/ACT NA SUSP: 1.0000 | Freq: Every day | NASAL | 2 refills | Status: AC

## 2023-02-04 MED ORDER — SPACER/AERO-HOLDING CHAMBERS DEVI: 2.0000 | Freq: Four times a day (QID) | 0 refills | Status: AC | PRN

## 2023-02-04 NOTE — ED Provider Notes (Signed)
[facility] Provider Note   CSN: [id number] Arrival date & time: 02/04/23  1447     History  Chief Complaint  Patient presents with   Cough    Sonia Morris is a 37 y.o. female.  HPI Patient reports a week ago she started getting pressure in her nose and sinuses and drainage with coughing.  She denies she had a sore throat.  No earache.  She reports the coughing got worse and she has been trying to take Delsym around-the-clock for the past several days.  She has not gotten any improvement.  She denies chest pain or shortness of breath.  No fevers or bodyaches.  She reports she does have a lot of problems with seasonal allergies as well.  Pollen is currently very dense.    Home Medications Prior to Admission medications   Medication Sig Start Date End Date Taking? Authorizing Provider  albuterol (VENTOLIN HFA) 108 (90 Base) MCG/ACT inhaler Inhale 1-2 puffs into the lungs every 6 (six) hours as needed for wheezing (cough). 02/04/23  Yes Arby Barrette, MD  benzonatate (TESSALON) 200 MG capsule Take 1 capsule (200 mg total) by mouth 3 (three) times daily as needed for cough. 02/04/23  Yes Arby Barrette, MD  fluticasone (FLONASE) 50 MCG/ACT nasal spray Place 1 spray into both nostrils daily. 02/04/23  Yes Arby Barrette, MD  loratadine (CLARITIN) 10 MG tablet Take 1 tablet (10 mg total) by mouth daily. 02/04/23  Yes Arby Barrette, MD  Spacer/Aero-Holding Deretha Emory DEVI 2 puffs by Does not apply route every 6 (six) hours as needed. 02/04/23  Yes Mikka Kissner, Lebron Conners, MD  lidocaine (XYLOCAINE) 2 % solution Use as directed 15 mLs in the mouth or throat as needed for mouth pain. 09/16/22   Prosperi, Christian H, PA-C  Promethazine-Codeine 6.25-10 MG/5ML SOLN Take 5 mLs by mouth every 4 (four) hours as needed (for cough). 10/22/22   Molpus, John, MD  saccharomyces boulardii (FLORASTOR) 250 MG capsule This is a probiotic to help replace the good bacteria the  antibiotics kill.  Your can buy this at any drugstore over the counter where the stool products are.  Brand and type isn't real important.  Pick one, and follow package instructions for the one month. 05/17/19   Sherrie George, PA-C      Allergies    Vicodin [hydrocodone-acetaminophen]    Review of Systems   Review of Systems  Physical Exam Updated Vital Signs BP (!) 160/87 (BP Location: Left Arm)   Pulse 88   Temp 98 F (36.7 C) (Oral)   Resp 18   Ht  (1.753 m)   Wt 113.4 kg   SpO2 100%   BMI 36.92 kg/m  Physical Exam Constitutional:      Comments: Alert nontoxic clinically well appearance.  No respiratory distress.  HENT:     Ears:     Comments: Left TM normal.  Right TM obscured by cerumen.    Nose:     Comments: Slight bogginess of the nasal mucosa.  No purulent drainage.    Mouth/Throat:     Mouth: Mucous membranes are moist.     Pharynx: Oropharynx is clear.     Comments: Tonsils are mildly erythematous but no exudate or ulcerations. Eyes:     Extraocular Movements: Extraocular movements intact.     Conjunctiva/sclera: Conjunctivae normal.     Pupils: Pupils are equal, round, and reactive to light.  Cardiovascular:     Rate and  Rhythm: Normal rate and regular rhythm.  Pulmonary:     Effort: Pulmonary effort is normal.     Breath sounds: Normal breath sounds.  Musculoskeletal:        General: Normal range of motion.     Cervical back: Neck supple.  Skin:    General: Skin is warm and dry.  Neurological:     General: No focal deficit present.     Mental Status: She is oriented to person, place, and time.  Psychiatric:        Mood and Affect: Mood normal.     ED Results / Procedures / Treatments   Labs (all labs ordered are listed, but only abnormal results are displayed) Labs Reviewed  RESP PANEL BY RT-PCR (RSV, FLU A&B, COVID)  [id number]    EKG None  Radiology No results found.  Procedures Procedures    Medications Ordered in  ED Medications - No data to display  ED Course/ Medical Decision Making/ A&P                             Medical Decision Making Risk OTC drugs. Prescription drug management.   Patient presents with chief complaint of cough.  She also reports sinus congestion and drainage.  She denies any shortness of breath, chest pain, sore throat or earache.  Also no associated fever or general malaise or body ache.  Patient tested negative for COVID and influenza.  At this time she is clinically well in appearance.  I do not think additional diagnostic testing is indicated.  Patient also endorses chronic problems with sinus congestion.  Currently pollen levels are very high.  Plan will be to address this as a possible viral bronchitis with persistent coughing with seasonal allergy.  I have reviewed the plan with the patient to start a daily nonsedating antihistamine.  Patient will not take any decongestants given her chronically poorly controlled hypertension.  She reports she is aware that her blood pressures are often higher than ideal.  Will start Flonase and Tessalon Perles as needed with as needed albuterol inhaler for wheezing and coughing paroxysms.        Final Clinical Impression(s) / ED Diagnoses Final diagnoses:  Viral upper respiratory tract infection  Allergic rhinitis, unspecified seasonality, unspecified trigger    Rx / DC Orders ED Discharge Orders          Ordered    loratadine (CLARITIN) 10 MG tablet  Daily        02/04/23 1554    benzonatate (TESSALON) 200 MG capsule  3 times daily PRN        02/04/23 1554    albuterol (VENTOLIN HFA) 108 (90 Base) MCG/ACT inhaler  Every 6 hours PRN        02/04/23 1554    Spacer/Aero-Holding Chambers DEVI  Every 6 hours PRN        02/04/23 1554    fluticasone (FLONASE) 50 MCG/ACT nasal spray  Daily        02/04/23 1554              Arby Barrette, MD 02/19/23 1137

## 2023-02-04 NOTE — ED Triage Notes (Signed)
Sick since llast Tuesday ,hoarse and cough and tired

## 2023-02-04 NOTE — ED Notes (Signed)
Discharge instructions reviewed with patient. Patient verbalizes understanding, no further questions at this time. Medications/prescriptions and follow up information provided. No acute distress noted at time of departure.  

## 2023-02-04 NOTE — Discharge Instructions (Addendum)
Your flu and COVID tests are negative.    You may have a viral respiratory illness.  This also may be worsened by severe pollen this spring.  Take Claritin as prescribed daily.  Use Flonase nasal spray daily as prescribed.  For coughing episodes you may take Tessalon Perles every 8 hours.  Swallow this whole.  Do not chew on it or suck on it.  You may also try 2 puffs from the albuterol inhaler with spacer to help with coughing and/or wheezing.  See your doctor for recheck in 7 to 10 days.  Return to the emergency department if you are getting fevers, chest pain, shortness of breath or worsening symptoms.

## 2023-07-14 ENCOUNTER — Encounter (HOSPITAL_BASED_OUTPATIENT_CLINIC_OR_DEPARTMENT_OTHER): Payer: Self-pay | Admitting: Urology

## 2023-07-14 ENCOUNTER — Emergency Department (HOSPITAL_BASED_OUTPATIENT_CLINIC_OR_DEPARTMENT_OTHER)
Admission: EM | Admit: 2023-07-14 | Discharge: 2023-07-14 | Disposition: A | Discharge status: Home / Self Care | Payer: BC Managed Care – PPO

## 2023-07-14 ENCOUNTER — Other Ambulatory Visit: Payer: Self-pay

## 2023-07-14 DIAGNOSIS — I10 Essential (primary) hypertension: Secondary | ICD-10-CM | POA: Insufficient documentation

## 2023-07-14 DIAGNOSIS — Z20822 Contact with and (suspected) exposure to covid-19: Secondary | ICD-10-CM | POA: Diagnosis not present

## 2023-07-14 DIAGNOSIS — B349 Viral infection, unspecified: Secondary | ICD-10-CM | POA: Diagnosis not present

## 2023-07-14 DIAGNOSIS — R519 Headache, unspecified: Secondary | ICD-10-CM | POA: Diagnosis present

## 2023-07-14 DIAGNOSIS — Z79899 Other long term (current) drug therapy: Secondary | ICD-10-CM | POA: Diagnosis not present

## 2023-07-14 LAB — RESP PANEL BY RT-PCR (RSV, FLU A&B, COVID)  RVPGX2
Influenza A by PCR: NEGATIVE
Influenza B by PCR: NEGATIVE
Resp Syncytial Virus by PCR: NEGATIVE
SARS Coronavirus 2 by RT PCR: NEGATIVE

## 2023-07-14 NOTE — ED Provider Notes (Signed)
[facility] Provider Note   CSN: [id number] Arrival date & time: 07/14/23  1402     History  Chief Complaint  Patient presents with   Covid Exposure    Sonia Morris is a 37 y.o. female with past medical history hypertension, migraines who presents to the ED complaining of headache, fatigue, nausea for the last 2 days.  Notes that she was exposed to someone at work with COVID.  No cough, congestion, fever, abdominal pain, vomiting, diarrhea, chest pain, shortness of breath, sore throat, or other complaints today.  States that she only came here to see if she has COVID.  No history of lung disease.       Home Medications Prior to Admission medications   Medication Sig Start Date End Date Taking? Authorizing Provider  albuterol (VENTOLIN HFA) 108 (90 Base) MCG/ACT inhaler Inhale 1-2 puffs into the lungs every 6 (six) hours as needed for wheezing (cough). 02/04/23   Arby Barrette, MD  benzonatate (TESSALON) 200 MG capsule Take 1 capsule (200 mg total) by mouth 3 (three) times daily as needed for cough. 02/04/23   Arby Barrette, MD  fluticasone (FLONASE) 50 MCG/ACT nasal spray Place 1 spray into both nostrils daily. 02/04/23   Arby Barrette, MD  lidocaine (XYLOCAINE) 2 % solution Use as directed 15 mLs in the mouth or throat as needed for mouth pain. 09/16/22   Prosperi, Christian H, PA-C  loratadine (CLARITIN) 10 MG tablet Take 1 tablet (10 mg total) by mouth daily. 02/04/23   Arby Barrette, MD  Promethazine-Codeine 6.25-10 MG/5ML SOLN Take 5 mLs by mouth every 4 (four) hours as needed (for cough). 10/22/22   Molpus, John, MD  saccharomyces boulardii (FLORASTOR) 250 MG capsule This is a probiotic to help replace the good bacteria the antibiotics kill.  Your can buy this at any drugstore over the counter where the stool products are.  Brand and type isn't real important.  Pick one, and follow package instructions for the one month. 05/17/19    Sherrie George, PA-C  Spacer/Aero-Holding Chambers DEVI 2 puffs by Does not apply route every 6 (six) hours as needed. 02/04/23   Arby Barrette, MD      Allergies    Vicodin [hydrocodone-acetaminophen]    Review of Systems   Review of Systems  All other systems reviewed and are negative.   Physical Exam Updated Vital Signs BP (!) 155/111 (BP Location: Left Arm)   Pulse 89   Temp (!) 97.5 F (36.4 C)   Resp 18   Ht 5\' 9"  (1.753 m)   Wt 113.4 kg   SpO2 97%   BMI 36.92 kg/m  Physical Exam Vitals and nursing note reviewed.  Constitutional:      General: She is not in acute distress.    Appearance: Normal appearance. She is not ill-appearing, toxic-appearing or diaphoretic.  HENT:     Head: Normocephalic and atraumatic.     Mouth/Throat:     Mouth: Mucous membranes are moist.     Pharynx: Oropharynx is clear. No oropharyngeal exudate or posterior oropharyngeal erythema.  Eyes:     General: No scleral icterus.    Extraocular Movements: Extraocular movements intact.     Conjunctiva/sclera: Conjunctivae normal.  Cardiovascular:     Rate and Rhythm: Normal rate and regular rhythm.     Heart sounds: No murmur heard. Pulmonary:     Effort: Pulmonary effort is normal. No respiratory distress.     Breath sounds:  Normal breath sounds. No stridor. No wheezing, rhonchi or rales.  Abdominal:     General: Abdomen is flat.     Palpations: Abdomen is soft.  Musculoskeletal:        General: Normal range of motion.     Cervical back: Normal range of motion and neck supple. No rigidity.     Right lower leg: No edema.     Left lower leg: No edema.  Lymphadenopathy:     Cervical: No cervical adenopathy.  Skin:    General: Skin is warm and dry.     Capillary Refill: Capillary refill takes less than 2 seconds.  Neurological:     General: No focal deficit present.     Mental Status: She is alert and oriented to person, place, and time.     GCS: GCS eye subscore is 4. GCS verbal  subscore is 5. GCS motor subscore is 6.     Cranial Nerves: Cranial nerves 2-12 are intact. No cranial nerve deficit, dysarthria or facial asymmetry.     Sensory: Sensation is intact.     Motor: Motor function is intact. No weakness, tremor, atrophy, abnormal muscle tone or seizure activity.     Coordination: Coordination is intact.  Psychiatric:        Behavior: Behavior normal.     ED Results / Procedures / Treatments   Labs (all labs ordered are listed, but only abnormal results are displayed) Labs Reviewed  RESP PANEL BY RT-PCR (RSV, FLU A&B, COVID)  RVPGX2    EKG None  Radiology No results found.  Procedures Procedures    Medications Ordered in ED Medications - No data to display  ED Course/ Medical Decision Making/ A&P                                 Medical Decision Making Amount and/or Complexity of Data Reviewed Labs: ordered. Decision-making details documented in ED Course.   Medical Decision Making:   Sonia Morris is a 37 y.o. female who presented to the ED today with headache detailed above.    Patient's presentation is complicated by their history of HTN, sick contact.  Complete initial physical exam performed, notably the patient was neurologically intact. LCTA, no respiratory distress. Well appearing.    Reviewed and confirmed nursing documentation for past medical history, family history, social history.    Initial Assessment:   With the patient's presentation, differential diagnosis includes but is not limited to COVID-19, influenza, RSV, viral illness, strep pharyngitis, bronchitis, pneumonia, migraine headache, sinusitis.  This is most consistent with an acute complicated illness  Initial Plan:  Viral swabs Objective evaluation as below reviewed   Initial Study Results:   Laboratory  All laboratory results reviewed without evidence of clinically relevant pathology.     Final Assessment and Plan:   37 year old female presents the ED with  headache, nausea, and fatigue as above.  States that she wanted tested for COVID.  Afebrile, nontoxic-appearing.  She is hypertensive.  Has a history of hypertension, adherent with medications.  Followed closely by PCP.  Neurologically intact.  Well-appearing.  Lungs clear to auscultation, no respiratory distress.  Viral swabs negative.  Offered headache treatment patient declines.  Patient does not desire any further workup in the ED today.  Discussed symptomatic treatment for viral syndrome and she will do this at home.  Strict ED return precautions given, all questions answered, stable for discharge.  Clinical Impression:  1. Viral syndrome      Discharge           Final Clinical Impression(s) / ED Diagnoses Final diagnoses:  Viral syndrome    Rx / DC Orders ED Discharge Orders     None         Tonette Lederer, PA-C 07/14/23 1529    Long, Arlyss Repress, MD 07/14/23 1556

## 2023-07-14 NOTE — Discharge Instructions (Addendum)
Thank you for letting us take care of you today. Overall, your workup and exam were reassuring and I believe your symptoms are likely related to a virus. Please see instructions below and attached for ways to care for yourself while recovering from your illness.  Viral Illness TREATMENT  Treatment is directed at relieving symptoms. There is no cure. Antibiotics are not effective because the infection is caused by a virus, not by bacteria. Treatment may include:  Increased fluid intake. Sports drinks offer valuable electrolytes, sugars, and fluids. Examples include Gatorade, Powerade, Pedialyte, etc. Water is also a good choice. Breathing heated mist or steam (vaporizer or shower).  Eating chicken soup or other clear broths and maintaining good nutrition.  Getting plenty of rest.  Using gargles or lozenges for comfort.  Increasing usage of your inhaler if you have bronchitis or asthma.  You may return to work when your temperature has returned to normal.  Gargle warm salt water and spit it out for sore throat. Benadryl can help decrease sinus secretions. Continue to alternate between Tylenol and ibuprofen for fever control, pain, and discomfort. For adults, you can take up to 1000mg  Tylenol every 6 hours and/or 600mg  ibuprofen every 6 hours. Do not take more than 4000mg  Tylenol or 3200mg  ibuprofen in 24 hours. For children, medication should be weight dosed. Please refer to attached dosing charts and/or medication bottle for appropriate dosing.   Your blood pressure was elevated. Keep a close check on this at home and follow up with your PCP if it remains uncontrolled.  Follow up with your primary care doctor in 5-7 days for recheck of ongoing symptoms.  Return to emergency department for emergent changing or worsening of symptoms.

## 2023-07-14 NOTE — ED Triage Notes (Signed)
Pt states headache, nausea, and fatigue that started 2 days ago  Denies fever  States covid exposure

## 2023-07-24 ENCOUNTER — Telehealth: Payer: BC Managed Care – PPO | Admitting: Physician Assistant

## 2023-07-24 DIAGNOSIS — J069 Acute upper respiratory infection, unspecified: Secondary | ICD-10-CM | POA: Diagnosis not present

## 2023-07-24 DIAGNOSIS — R0602 Shortness of breath: Secondary | ICD-10-CM

## 2023-07-24 NOTE — Progress Notes (Signed)
Because of ongoing symptoms and episodes of shortness of breath at rest, you need assessment of your oxygen status and a detailed lung/heart exam. As such, I feel your condition warrants further evaluation and I recommend that you be seen in a face to face visit.   NOTE: There will be NO CHARGE for this eVisit   If you are having a true medical emergency please call 911.      For an urgent face to face visit, [facility] has eight urgent care centers for your convenience:   NEW!! [facility] Center at [facility] Get Driving Directions [phone] [address], [address] [place], [address]    [facility] Health Urgent Care Center at [facility] Get Driving Directions [phone] [address] [place], [place] [address]   [facility] Health Urgent Care Center [facility]) Get Driving Directions [phone] [address] [place], [place] [address]  [facility] Health Urgent Care Center [facility]) Get Driving Directions [phone] [address] [place],  [place]  [address]  [facility] Health Urgent Care Center [facility] - at [employer]  [phone] [place],  [place] [address]   [facility] Health Urgent Care at [facility] Get Driving Directions [phone] [address] [place], [place] [address]   [facility] Health Urgent Care at [facility] Get Driving Directions  [phone] [address] [place], [place] [address]   [facility] Health Urgent Care at [facility] Directions [phone] [address] [place], [place] [address]  Your MyChart E-visit questionnaire answers were reviewed by a board certified advanced clinical practitioner to complete your personal care plan based on your specific symptoms.  Thank you for using e-Visits.

## 2023-07-24 NOTE — Progress Notes (Signed)
To patient -- I am glad symptoms are improved. In your first visit, you noted the shortness of breath that comes and goes all of a sudden. This is something you need to have evaluated as it is concerning. That is why an in-person visit was recommended. At this point you would not be contagious if you had the flu or COVID when symptoms initially started so I am ok writing a note stating you are no longer contagious and can return to work at next shift, but you still need to be evaluated in person for the episodes of shortness of breath

## 2023-07-29 NOTE — Progress Notes (Signed)
I have spent 5 minutes in review of e-visit questionnaire, review and updating patient chart, medical decision making and response to patient.   Mia Milan Cody Jacklynn Dehaas, PA-C    

## 2023-09-14 ENCOUNTER — Emergency Department (HOSPITAL_BASED_OUTPATIENT_CLINIC_OR_DEPARTMENT_OTHER)
Admission: EM | Admit: 2023-09-14 | Discharge: 2023-09-14 | Disposition: A | Discharge status: Home / Self Care | Payer: BC Managed Care – PPO | Attending: Emergency Medicine | Admitting: Emergency Medicine

## 2023-09-14 ENCOUNTER — Other Ambulatory Visit: Payer: Self-pay

## 2023-09-14 ENCOUNTER — Encounter (HOSPITAL_BASED_OUTPATIENT_CLINIC_OR_DEPARTMENT_OTHER): Payer: Self-pay

## 2023-09-14 DIAGNOSIS — Z79899 Other long term (current) drug therapy: Secondary | ICD-10-CM | POA: Insufficient documentation

## 2023-09-14 DIAGNOSIS — I10 Essential (primary) hypertension: Secondary | ICD-10-CM | POA: Insufficient documentation

## 2023-09-14 DIAGNOSIS — B349 Viral infection, unspecified: Secondary | ICD-10-CM | POA: Insufficient documentation

## 2023-09-14 DIAGNOSIS — Z20822 Contact with and (suspected) exposure to covid-19: Secondary | ICD-10-CM | POA: Insufficient documentation

## 2023-09-14 LAB — RESP PANEL BY RT-PCR (RSV, FLU A&B, COVID)  RVPGX2
Influenza A by PCR: NEGATIVE
Influenza B by PCR: NEGATIVE
Resp Syncytial Virus by PCR: NEGATIVE
SARS Coronavirus 2 by RT PCR: NEGATIVE

## 2023-09-14 MED ORDER — HYDROCHLOROTHIAZIDE 25 MG PO TABS: 25.0000 mg | ORAL_TABLET | Freq: Every day | ORAL | 0 refills | Status: AC

## 2023-09-14 MED ORDER — IBUPROFEN 800 MG PO TABS
800.0000 mg | ORAL_TABLET | Freq: Once | ORAL | Status: AC
  Administered 2023-09-14: 800 mg via ORAL
  Filled 2023-09-14: qty 1

## 2023-09-14 MED ORDER — ACETAMINOPHEN 500 MG PO TABS
1000.0000 mg | ORAL_TABLET | Freq: Once | ORAL | Status: DC
  Filled 2023-09-14: qty 2

## 2023-09-14 MED ORDER — ONDANSETRON 8 MG PO TBDP: ORAL_TABLET | ORAL | 0 refills | Status: AC

## 2023-09-14 MED ORDER — BENZONATATE 200 MG PO CAPS: 200.0000 mg | ORAL_CAPSULE | Freq: Three times a day (TID) | ORAL | 0 refills | Status: AC | PRN

## 2023-09-14 MED ORDER — ONDANSETRON 4 MG PO TBDP
8.0000 mg | ORAL_TABLET | Freq: Once | ORAL | Status: AC
  Administered 2023-09-14: 8 mg via ORAL
  Filled 2023-09-14: qty 2

## 2023-09-14 NOTE — ED Provider Notes (Signed)
[facility] Provider Note   CSN: [id number] Arrival date & time: 09/14/23  0532     History  Chief Complaint  Patient presents with   Headache    Sonia Morris is a 37 y.o. female.  The history is provided by the patient.  URI Presenting symptoms: congestion and sore throat   Presenting symptoms: no cough, no ear pain and no fever   Congestion:    Location:  Nasal Severity:  Moderate Onset quality:  Gradual Duration:  5 days Timing:  Constant Progression:  Unchanged Chronicity:  New Relieved by:  Nothing Worsened by:  Nothing Ineffective treatments:  None tried Associated symptoms comment:  Also nausea vomiting and diarrhea  Risk factors: not elderly, no chronic cardiac disease, no chronic kidney disease, no chronic respiratory disease and no diabetes mellitus   Patient with HTN not on medication presents with 5 days of viral symptoms.       Home Medications Prior to Admission medications   Medication Sig Start Date End Date Taking? Authorizing Provider  hydrochlorothiazide (HYDRODIURIL) 25 MG tablet Take 1 tablet (25 mg total) by mouth daily. 09/14/23  Yes Terrill Wauters, MD  ondansetron (ZOFRAN-ODT) 8 MG disintegrating tablet 8mg  ODT q8 hours prn nausea 09/14/23  Yes Sanoe Hazan, MD  albuterol (VENTOLIN HFA) 108 (90 Base) MCG/ACT inhaler Inhale 1-2 puffs into the lungs every 6 (six) hours as needed for wheezing (cough). 02/04/23   Arby Barrette, MD  benzonatate (TESSALON) 200 MG capsule Take 1 capsule (200 mg total) by mouth 3 (three) times daily as needed for cough. 09/14/23   Piedad Standiford, MD  fluticasone (FLONASE) 50 MCG/ACT nasal spray Place 1 spray into both nostrils daily. 02/04/23   Arby Barrette, MD  lidocaine (XYLOCAINE) 2 % solution Use as directed 15 mLs in the mouth or throat as needed for mouth pain. 09/16/22   Prosperi, Christian H, PA-C  loratadine (CLARITIN) 10 MG tablet Take 1 tablet (10 mg total)  by mouth daily. 02/04/23   Arby Barrette, MD  Promethazine-Codeine 6.25-10 MG/5ML SOLN Take 5 mLs by mouth every 4 (four) hours as needed (for cough). 10/22/22   Molpus, John, MD  saccharomyces boulardii (FLORASTOR) 250 MG capsule This is a probiotic to help replace the good bacteria the antibiotics kill.  Your can buy this at any drugstore over the counter where the stool products are.  Brand and type isn't real important.  Pick one, and follow package instructions for the one month. 05/17/19   Sherrie George, PA-C  Spacer/Aero-Holding Chambers DEVI 2 puffs by Does not apply route every 6 (six) hours as needed. 02/04/23   Arby Barrette, MD      Allergies    Vicodin [hydrocodone-acetaminophen]    Review of Systems   Review of Systems  Constitutional:  Negative for fever.  HENT:  Positive for congestion and sore throat. Negative for ear pain.   Respiratory:  Negative for cough.   Gastrointestinal:  Positive for diarrhea, nausea and vomiting.  All other systems reviewed and are negative.   Physical Exam Updated Vital Signs BP (!) 179/104   Pulse 86   Temp 97.7 F (36.5 C) (Oral)   Resp 20   Ht 5\' 9"  (1.753 m)   Wt 102.5 kg   SpO2 99%   BMI 33.37 kg/m  Physical Exam Vitals and nursing note reviewed.  Constitutional:      General: She is not in acute distress.    Appearance: Normal  appearance. She is well-developed.  HENT:     Head: Normocephalic and atraumatic.     Nose: Congestion present.     Mouth/Throat:     Pharynx: No oropharyngeal exudate.  Eyes:     Pupils: Pupils are equal, round, and reactive to light.  Cardiovascular:     Rate and Rhythm: Normal rate and regular rhythm.     Pulses: Normal pulses.     Heart sounds: Normal heart sounds.  Pulmonary:     Effort: Pulmonary effort is normal. No respiratory distress.     Breath sounds: Normal breath sounds.  Abdominal:     General: Bowel sounds are normal. There is no distension.     Palpations: Abdomen is soft.      Tenderness: There is no abdominal tenderness. There is no guarding or rebound.  Musculoskeletal:        General: Normal range of motion.     Cervical back: Normal range of motion and neck supple.  Lymphadenopathy:     Cervical: No cervical adenopathy.  Skin:    General: Skin is warm and dry.     Capillary Refill: Capillary refill takes less than 2 seconds.     Findings: No erythema or rash.  Neurological:     General: No focal deficit present.     Mental Status: She is alert and oriented to person, place, and time.     Deep Tendon Reflexes: Reflexes normal.  Psychiatric:        Mood and Affect: Mood normal.     ED Results / Procedures / Treatments   Labs (all labs ordered are listed, but only abnormal results are displayed) Labs Reviewed  RESP PANEL BY RT-PCR (RSV, FLU A&B, COVID)  RVPGX2    EKG None  Radiology No results found.  Procedures Procedures    Medications Ordered in ED Medications  acetaminophen (TYLENOL) tablet 1,000 mg (1,000 mg Oral Not Given 09/14/23 0642)  ondansetron (ZOFRAN-ODT) disintegrating tablet 8 mg (8 mg Oral Given 09/14/23 0616)  ibuprofen (ADVIL) tablet 800 mg (800 mg Oral Given 09/14/23 1610)    ED Course/ Medical Decision Making/ A&P                                 Medical Decision Making Patient with URI symptoms and associated vomiting and diarrhea, no medication for several days   Amount and/or Complexity of Data Reviewed External Data Reviewed: notes.    Details: Previous notes reviewed  Labs: ordered.    Details: Negative covid and flu   Risk OTC drugs. Prescription drug management. Risk Details: Constellation of symptoms is viral in nature.  Patient is very well appearing and clinically is not dehydrated.  Have given zofran and started oral rehydration with gatorade.  No further emesis.  PO challenged successfully.  Headache treated with ibuprofen in the ED> will start hydrochlorothiazide for BP control and have  referred to community health and wellness for follow up.      why not admission, treatments were needed:1} Final Clinical Impression(s) / ED Diagnoses Final diagnoses:  Primary hypertension  Viral illness  Return for intractable cough, coughing up blood, fevers > 100.4 unrelieved by medication, shortness of breath, intractable vomiting, chest pain, shortness of breath, weakness, numbness, changes in speech, facial asymmetry, abdominal pain, passing out, Inability to tolerate liquids or food, cough, altered mental status or any concerns. No signs of systemic illness or infection. The patient  is nontoxic-appearing on exam and vital signs are within normal limits.  I have reviewed the triage vital signs and the nursing notes. Pertinent labs & imaging results that were available during my care of the patient were reviewed by me and considered in my medical decision making (see chart for details). After history, exam, and medical workup I feel the patient has been appropriately medically screened and is safe for discharge home. Pertinent diagnoses were discussed with the patient. Patient was given return precautions.   Rx / DC Orders ED Discharge Orders          Ordered    benzonatate (TESSALON) 200 MG capsule  3 times daily PRN        09/14/23 0608    ondansetron (ZOFRAN-ODT) 8 MG disintegrating tablet        09/14/23 0701    hydrochlorothiazide (HYDRODIURIL) 25 MG tablet  Daily        09/14/23 0701              Floriene Jeschke, MD 09/14/23 4098

## 2023-09-14 NOTE — ED Triage Notes (Addendum)
Pt. States she's been having flu like symptoms that started Tuesday. Pt. States she's had a cough, CP, sore throat diarrhea throughout the week. pt. Denies fever and chest pain at this time. Also states she feels dehydrated and her head is hurting. Pt. Reports vomiting 3X today.

## 2023-12-21 ENCOUNTER — Emergency Department (HOSPITAL_BASED_OUTPATIENT_CLINIC_OR_DEPARTMENT_OTHER): Payer: Self-pay

## 2023-12-21 ENCOUNTER — Emergency Department (HOSPITAL_BASED_OUTPATIENT_CLINIC_OR_DEPARTMENT_OTHER)
Admission: EM | Admit: 2023-12-21 | Discharge: 2023-12-21 | Disposition: A | Discharge status: Home / Self Care | Payer: Self-pay | Attending: Emergency Medicine | Admitting: Emergency Medicine

## 2023-12-21 ENCOUNTER — Encounter (HOSPITAL_BASED_OUTPATIENT_CLINIC_OR_DEPARTMENT_OTHER): Payer: Self-pay | Admitting: Urology

## 2023-12-21 DIAGNOSIS — E871 Hypo-osmolality and hyponatremia: Secondary | ICD-10-CM | POA: Insufficient documentation

## 2023-12-21 DIAGNOSIS — Z79899 Other long term (current) drug therapy: Secondary | ICD-10-CM | POA: Insufficient documentation

## 2023-12-21 DIAGNOSIS — N83202 Unspecified ovarian cyst, left side: Secondary | ICD-10-CM | POA: Insufficient documentation

## 2023-12-21 DIAGNOSIS — R109 Unspecified abdominal pain: Secondary | ICD-10-CM

## 2023-12-21 LAB — CBC WITH DIFFERENTIAL/PLATELET
Abs Immature Granulocytes: 0.03 10*3/uL (ref 0.00–0.07)
Basophils Absolute: 0.1 10*3/uL (ref 0.0–0.1)
Basophils Relative: 1 %
Eosinophils Absolute: 0.1 10*3/uL (ref 0.0–0.5)
Eosinophils Relative: 1 %
HCT: 37.3 % (ref 36.0–46.0)
Hemoglobin: 11.6 g/dL — ABNORMAL LOW (ref 12.0–15.0)
Immature Granulocytes: 0 %
Lymphocytes Relative: 25 %
Lymphs Abs: 2.4 10*3/uL (ref 0.7–4.0)
MCH: 23.6 pg — ABNORMAL LOW (ref 26.0–34.0)
MCHC: 31.1 g/dL (ref 30.0–36.0)
MCV: 76 fL — ABNORMAL LOW (ref 80.0–100.0)
Monocytes Absolute: 0.7 10*3/uL (ref 0.1–1.0)
Monocytes Relative: 7 %
Neutro Abs: 6.4 10*3/uL (ref 1.7–7.7)
Neutrophils Relative %: 66 %
Platelets: 264 10*3/uL (ref 150–400)
RBC: 4.91 MIL/uL (ref 3.87–5.11)
RDW: 14.6 % (ref 11.5–15.5)
WBC: 9.8 10*3/uL (ref 4.0–10.5)
nRBC: 0 % (ref 0.0–0.2)

## 2023-12-21 LAB — COMPREHENSIVE METABOLIC PANEL
ALT: 36 U/L (ref 0–44)
AST: 36 U/L (ref 15–41)
Albumin: 3.7 g/dL (ref 3.5–5.0)
Alkaline Phosphatase: 67 U/L (ref 38–126)
Anion gap: 10 (ref 5–15)
BUN: 12 mg/dL (ref 6–20)
CO2: 21 mmol/L — ABNORMAL LOW (ref 22–32)
Calcium: 8.5 mg/dL — ABNORMAL LOW (ref 8.9–10.3)
Chloride: 102 mmol/L (ref 98–111)
Creatinine, Ser: 0.77 mg/dL (ref 0.44–1.00)
GFR, Estimated: >60 mL/min (ref >60)
Glucose, Bld: 91 mg/dL (ref 70–99)
Potassium: 4.3 mmol/L (ref 3.5–5.1)
Sodium: 133 mmol/L — ABNORMAL LOW (ref 135–145)
Total Bilirubin: 1 mg/dL (ref 0.0–1.2)
Total Protein: 7.9 g/dL (ref 6.5–8.1)

## 2023-12-21 LAB — LIPASE, BLOOD: Lipase: 24 U/L (ref 11–51)

## 2023-12-21 LAB — URINALYSIS, ROUTINE W REFLEX MICROSCOPIC
Bilirubin Urine: NEGATIVE
Glucose, UA: NEGATIVE mg/dL
Ketones, ur: NEGATIVE mg/dL
Leukocytes,Ua: NEGATIVE
Nitrite: NEGATIVE
Protein, ur: 30 mg/dL — AB
Specific Gravity, Urine: 1.02 (ref 1.005–1.030)
pH: 6 (ref 5.0–8.0)

## 2023-12-21 LAB — URINALYSIS, MICROSCOPIC (REFLEX)

## 2023-12-21 LAB — PREGNANCY, URINE: Preg Test, Ur: NEGATIVE

## 2023-12-21 MED ORDER — ALUM & MAG HYDROXIDE-SIMETH 200-200-20 MG/5ML PO SUSP
30.0000 mL | Freq: Once | ORAL | Status: AC
  Administered 2023-12-21: 30 mL via ORAL
  Filled 2023-12-21: qty 30

## 2023-12-21 MED ORDER — MORPHINE SULFATE (PF) 4 MG/ML IV SOLN
4.0000 mg | Freq: Once | INTRAVENOUS | Status: AC
  Administered 2023-12-21: 4 mg via INTRAVENOUS
  Filled 2023-12-21: qty 1

## 2023-12-21 MED ORDER — ONDANSETRON HCL 4 MG/2ML IJ SOLN
4.0000 mg | Freq: Once | INTRAMUSCULAR | Status: AC
  Administered 2023-12-21: 4 mg via INTRAVENOUS
  Filled 2023-12-21: qty 2

## 2023-12-21 MED ORDER — KETOROLAC TROMETHAMINE 15 MG/ML IJ SOLN
15.0000 mg | Freq: Once | INTRAMUSCULAR | Status: AC
  Administered 2023-12-21: 15 mg via INTRAVENOUS
  Filled 2023-12-21: qty 1

## 2023-12-21 NOTE — ED Notes (Signed)
 Attempt at IV x 1 with no result but blood received and taken to lab.  Guaze and tegaderm applied to site.  Pt tolerated wel.

## 2023-12-21 NOTE — ED Notes (Signed)
 Patient unable to provide urine sample at this time, patient will attempt later.

## 2023-12-21 NOTE — Discharge Instructions (Addendum)
 You were evaluated in the emergency room for abdominal pain.  Your lab work did not show any significant abnormality.  Your imaging scan showed  That you have a 2.9 cm cyst on your left ovary.  No further follow-up is recommended for management of this.  The abnormality seen on your CT scan was actually just your ovary which is without abnormality  I recommend calling your primary doctor to schedule a close follow-up appointment.  If you experience any new or worsening symptoms including persistent fevers and vomiting please return to the emergency room.

## 2023-12-21 NOTE — ED Provider Notes (Signed)
 [place] [facility] Provider Note   CSN: [id number] Arrival date & time: 12/21/23  1401     History  Chief Complaint  Patient presents with   Pelvic Pain    Sonia Morris is a 38 y.o. female who presents with suprapubic abdominal pain with right flank pain over the past several days.  Denies any injury or trauma.  No dysuria or increased frequency.  No vaginal bleeding or discharge.  No vomiting or diarrhea.  Reports prior history of cholecystectomy.  No history of kidney stones.   Pelvic Pain       Home Medications Prior to Admission medications   Medication Sig Start Date End Date Taking? Authorizing Provider  albuterol (VENTOLIN HFA) 108 (90 Base) MCG/ACT inhaler Inhale 1-2 puffs into the lungs every 6 (six) hours as needed for wheezing (cough). 02/04/23   Arby Barrette, MD  benzonatate (TESSALON) 200 MG capsule Take 1 capsule (200 mg total) by mouth 3 (three) times daily as needed for cough. 09/14/23   Palumbo, April, MD  fluticasone (FLONASE) 50 MCG/ACT nasal spray Place 1 spray into both nostrils daily. 02/04/23   Arby Barrette, MD  hydrochlorothiazide (HYDRODIURIL) 25 MG tablet Take 1 tablet (25 mg total) by mouth daily. 09/14/23   Palumbo, April, MD  lidocaine (XYLOCAINE) 2 % solution Use as directed 15 mLs in the mouth or throat as needed for mouth pain. 09/16/22   Prosperi, Christian H, PA-C  loratadine (CLARITIN) 10 MG tablet Take 1 tablet (10 mg total) by mouth daily. 02/04/23   Arby Barrette, MD  ondansetron (ZOFRAN-ODT) 8 MG disintegrating tablet 8mg  ODT q8 hours prn nausea 09/14/23   Palumbo, April, MD  Promethazine-Codeine 6.25-10 MG/5ML SOLN Take 5 mLs by mouth every 4 (four) hours as needed (for cough). 10/22/22   Molpus, John, MD  saccharomyces boulardii (FLORASTOR) 250 MG capsule This is a probiotic to help replace the good bacteria the antibiotics kill.  Your can buy this at any drugstore over the counter where the stool  products are.  Brand and type isn't real important.  Pick one, and follow package instructions for the one month. 05/17/19   Sherrie George, PA-C  Spacer/Aero-Holding Chambers DEVI 2 puffs by Does not apply route every 6 (six) hours as needed. 02/04/23   Arby Barrette, MD      Allergies    Vicodin [hydrocodone-acetaminophen]    Review of Systems   Review of Systems  Genitourinary:  Positive for pelvic pain.    Physical Exam Updated Vital Signs BP (!) 158/103 (BP Location: Right Arm)   Pulse 87   Temp 97.7 F (36.5 C) (Oral)   Resp 18   Ht 5\' 9"  (1.753 m)   Wt 102.5 kg   SpO2 100%   BMI 33.37 kg/m  Physical Exam Vitals and nursing note reviewed.  Constitutional:      General: She is not in acute distress.    Appearance: She is well-developed.  HENT:     Head: Normocephalic and atraumatic.  Eyes:     Conjunctiva/sclera: Conjunctivae normal.  Cardiovascular:     Rate and Rhythm: Normal rate and regular rhythm.     Heart sounds: No murmur heard. Pulmonary:     Effort: Pulmonary effort is normal. No respiratory distress.     Breath sounds: Normal breath sounds.  Abdominal:     Palpations: Abdomen is soft.     Comments: Generalized abdominal tenderness, without rebound or guarding, no peritoneal signs  Musculoskeletal:        General: No swelling.     Cervical back: Neck supple.  Skin:    General: Skin is warm and dry.     Capillary Refill: Capillary refill takes less than 2 seconds.  Neurological:     Mental Status: She is alert.  Psychiatric:        Mood and Affect: Mood normal.     ED Results / Procedures / Treatments   Labs (all labs ordered are listed, but only abnormal results are displayed) Labs Reviewed  URINALYSIS, ROUTINE W REFLEX MICROSCOPIC - Abnormal; Notable for the following components:      Result Value   APPearance HAZY (*)    Hgb urine dipstick MODERATE (*)    Protein, ur 30 (*)    All other components within normal limits  CBC WITH  DIFFERENTIAL/PLATELET - Abnormal; Notable for the following components:   Hemoglobin 11.6 (*)    MCV 76.0 (*)    MCH 23.6 (*)    All other components within normal limits  COMPREHENSIVE METABOLIC PANEL - Abnormal; Notable for the following components:   Sodium 133 (*)    CO2 21 (*)    Calcium 8.5 (*)    All other components within normal limits  URINALYSIS, MICROSCOPIC (REFLEX) - Abnormal; Notable for the following components:   Bacteria, UA RARE (*)    All other components within normal limits  PREGNANCY, URINE  LIPASE, BLOOD    EKG None  Radiology CT Renal Stone Study Result Date: 12/21/2023 CLINICAL DATA:  Abdominal/flank pain, stone suspected EXAM: CT ABDOMEN AND PELVIS WITHOUT CONTRAST TECHNIQUE: Multidetector CT imaging of the abdomen and pelvis was performed following the standard protocol without IV contrast. RADIATION DOSE REDUCTION: This exam was performed according to the departmental dose-optimization program which includes automated exposure control, adjustment of the mA and/or kV according to patient size and/or use of iterative reconstruction technique. COMPARISON:  May 15, 2019 FINDINGS: Evaluation is limited by lack of IV contrast. Lower chest: No acute abnormality. Hepatobiliary: Status post cholecystectomy. Unremarkable noncontrast appearance of the liver. Pancreas: No peripancreatic fat stranding. Spleen: Unremarkable. Adrenals/Urinary Tract: The adrenal glands are unremarkable. No hydronephrosis. No obstructing nephrolithiasis. Bladder is decompressed and unremarkable. Stomach/Bowel: No evidence of bowel obstruction. Appendix is normal. Stomach is decompressed and otherwise unremarkable. Vascular/Lymphatic: No significant vascular findings are present. No enlarged abdominal or pelvic lymph nodes. Reproductive: Status post hysterectomy. Other: No free air or free fluid. There is a rounded soft tissue area in the low RIGHT pelvis which measures 32 x 31 by 28 mm (series 3,  image 77; series 602, image 92).). This may reflect a conglomeration of small bowel loops or a nonspecific soft tissue mass. Evaluation is limited due to lack of IV or oral contrast. Musculoskeletal: No acute or significant osseous findings. Similar asymmetric sclerosis of the RIGHT sacroiliac joint compared to prior. IMPRESSION: 1. There is a rounded soft tissue area in the low RIGHT pelvis which measures 32 x 31 by 28 mm. This may reflect a conglomeration of small bowel loops or a nonspecific soft tissue mass such as an endometrioma. Evaluation is limited due to lack of IV or oral contrast. Consider further evaluation with dedicated pelvic ultrasound 2. No obstructing nephrolithiasis. No hydronephrosis. Electronically Signed   By: Meda Klinefelter M.D.   On: 12/21/2023 17:08    Procedures Procedures    Medications Ordered in ED Medications  ketorolac (TORADOL) 15 MG/ML injection 15 mg (15 mg Intravenous Given  12/21/23 1612)  ondansetron (ZOFRAN) injection 4 mg (4 mg Intravenous Given 12/21/23 1612)  alum & mag hydroxide-simeth (MAALOX/MYLANTA) 200-200-20 MG/5ML suspension 30 mL (30 mLs Oral Given 12/21/23 1613)  morphine (PF) 4 MG/ML injection 4 mg (4 mg Intravenous Given 12/21/23 1805)    ED Course/ Medical Decision Making/ A&P Clinical Course as of 12/21/23 1847  Sun Dec 21, 2023  1724 Acute on chronic abdominal pain.  [CC]    Clinical Course User Index [CC] Glyn Ade, MD                                 Medical Decision Making Amount and/or Complexity of Data Reviewed Labs: ordered.   This patient presents to the ED with chief complaint(s) of abdominal pain.  The complaint involves an extensive differential diagnosis and also carries with it a high risk of complications and morbidity.   pertinent past medical history as listed in HPI  The differential diagnosis includes  gastroenteritis, colitis, small bowel obstruction, appendicitis, cholecystitis, hepatobiliary pathology,  gastritis, PUD, ACS, aortic dissection, diverticulosis/diverticulitis, pancreatitis, nephrolithiasis, AAA, UTI, pyelonephritis, ruptured ectopic pregnancy, PID, ovarian/torsion.  The initial plan is to  Will start with basic labs Additional history obtained:  Records reviewed previous admission documents and Care Everywhere/External Records  Initial Assessment:   Patient presents hypertensive to 170/100 with complaints of abdominal and right flank pain over the past several days.  On exam she has generalized nonfocal tenderness.  No urinary symptoms to suggest cystitis or pyelonephritis.  No cough or congestion to suggest URI.  No vomiting or diarrhea to suggest gastroenteritis.  She is afebrile and without leukocytosis, low suspicion for infectious etiology.  Her urine is without nitrates or leukocytes but does have a moderate amount of hemoglobin.  Overall most suspicious for nephrolithiasis.  Independent ECG interpretation:  none  Independent labs interpretation:  The following labs were independently interpreted:  CBC without significant abnormality, CMP with mild hyponatremia, UA with moderate amount of hemoglobin, negative nitrates or leukocytes, hCG negative  Independent visualization and interpretation of imaging: I independently visualized the following imaging with scope of interpretation limited to determining acute life threatening conditions related to emergency care: CT renal study, which revealed no obstructing stone or hydronephrosis, soft tissue mass noted in pelvic region.  Recommended obtaining pelvic ultrasound  Treatment and Reassessment: Patient given Toradol 15 IV and Zofran 4 IV and GI cocktail following first assessment  Upon reassessment patient reports persistent pain.  Discussed nonconclusive CTs findings will obtain pelvic ultrasound.  Consultations obtained:   None  Disposition:   Signout given to the PA [employer], ultrasound results pending.  Anticipate  discharge pending findings.   Social Determinants of Health:   none  This note was dictated with voice recognition software.  Despite best efforts at proofreading, errors may have occurred which can change the documentation meaning.          Final Clinical Impression(s) / ED Diagnoses Final diagnoses:  Abdominal pain, unspecified abdominal location    Rx / DC Orders ED Discharge Orders     None         Halford Decamp, PA-C 12/21/23 1856    Glyn Ade, MD 12/21/23 2328

## 2023-12-21 NOTE — ED Triage Notes (Signed)
 Pt states lower pelvic pain and lower back pain that started Thursday  Denies any trouble urination  Denies any vaginal discharge  Normal BM, denies N/V

## 2023-12-21 NOTE — ED Provider Notes (Signed)
 Care assumed from [facility], [place] at shift change.  Please see their note for further information  Briefly: Patient presents with abdominal pain and right flank pain for the last few days.  No associated symptoms.  Plan: Labs are benign, CT renal study was obtained which showed concern for a soft tissue area in the right pelvis.  Recommendation given for her to proceed with further evaluation with pelvic ultrasound.  This is pending at shift change and will determine dispo.  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   2.9 cm mildly complex cystic lesion in the left ovary is noted. No further follow-up is recommended.   Right ovary is noted low in the pelvis correspond to that seen on prior CT examination. No acute abnormality noted.  I have personally reviewed and interpreted this imaging and agree with radiology interpretation.  Upon reassessment, patient's pain is improved and she feels ready to go home.  Recommend she follow-up closely with her primary care doctor.  I informed her about the cyst on her left ovary and then no further follow-up is recommended for this. Evaluation and diagnostic testing in the emergency department does not suggest an emergent condition requiring admission or immediate intervention beyond what has been performed at this time.  Plan for discharge with close PCP follow-up.  Patient is understanding and amenable with plan, educated on red flag symptoms that would prompt immediate return.  Patient discharged in stable condition.  Findings and plan of care discussed with supervising physician Dr. Doran Durand who is in agreement.     Vear Clock 12/21/23 1946    Glyn Ade, MD 12/21/23 2328
